# Patient Record
Sex: Female | Born: 1965 | Race: Black or African American | Hispanic: No | Marital: Married | Smoking: Never smoker
Health system: Southern US, Community
[De-identification: ages and names within clinical notes are randomized; demographics above are authoritative.]

## PROBLEM LIST (undated history)

## (undated) DIAGNOSIS — Z973 Presence of spectacles and contact lenses: Secondary | ICD-10-CM

## (undated) DIAGNOSIS — N83209 Unspecified ovarian cyst, unspecified side: Secondary | ICD-10-CM

## (undated) DIAGNOSIS — D500 Iron deficiency anemia secondary to blood loss (chronic): Secondary | ICD-10-CM

## (undated) DIAGNOSIS — E611 Iron deficiency: Secondary | ICD-10-CM

## (undated) DIAGNOSIS — N939 Abnormal uterine and vaginal bleeding, unspecified: Secondary | ICD-10-CM

## (undated) DIAGNOSIS — D259 Leiomyoma of uterus, unspecified: Secondary | ICD-10-CM

## (undated) DIAGNOSIS — D649 Anemia, unspecified: Secondary | ICD-10-CM

## (undated) DIAGNOSIS — M419 Scoliosis, unspecified: Secondary | ICD-10-CM

## (undated) HISTORY — DX: Anemia, unspecified: D64.9

## (undated) HISTORY — PX: NO PAST SURGERIES: SHX2092

---

## 2000-03-22 ENCOUNTER — Other Ambulatory Visit: Admission: RE | Admit: 2000-03-22 | Discharge: 2000-03-22 | Payer: Self-pay | Admitting: Obstetrics and Gynecology

## 2001-03-24 ENCOUNTER — Other Ambulatory Visit: Admission: RE | Admit: 2001-03-24 | Discharge: 2001-03-24 | Payer: Self-pay | Admitting: Obstetrics and Gynecology

## 2002-10-23 ENCOUNTER — Other Ambulatory Visit: Admission: RE | Admit: 2002-10-23 | Discharge: 2002-10-23 | Payer: Self-pay | Admitting: Obstetrics and Gynecology

## 2004-01-31 ENCOUNTER — Other Ambulatory Visit: Admission: RE | Admit: 2004-01-31 | Discharge: 2004-01-31 | Payer: Self-pay | Admitting: Obstetrics and Gynecology

## 2005-03-19 ENCOUNTER — Other Ambulatory Visit: Admission: RE | Admit: 2005-03-19 | Discharge: 2005-03-19 | Payer: Self-pay | Admitting: Obstetrics and Gynecology

## 2005-04-01 ENCOUNTER — Encounter: Admission: RE | Admit: 2005-04-01 | Discharge: 2005-04-01 | Payer: Self-pay | Admitting: Obstetrics and Gynecology

## 2006-06-16 ENCOUNTER — Encounter: Admission: RE | Admit: 2006-06-16 | Discharge: 2006-06-16 | Payer: Self-pay | Admitting: Obstetrics and Gynecology

## 2007-08-17 ENCOUNTER — Encounter: Admission: RE | Admit: 2007-08-17 | Discharge: 2007-08-17 | Payer: Self-pay | Admitting: Obstetrics and Gynecology

## 2008-04-11 ENCOUNTER — Ambulatory Visit (HOSPITAL_COMMUNITY): Admission: RE | Admit: 2008-04-11 | Discharge: 2008-04-11 | Payer: Self-pay | Admitting: Obstetrics and Gynecology

## 2008-10-04 ENCOUNTER — Encounter: Admission: RE | Admit: 2008-10-04 | Discharge: 2008-10-04 | Payer: Self-pay | Admitting: Obstetrics and Gynecology

## 2009-10-16 ENCOUNTER — Encounter: Admission: RE | Admit: 2009-10-16 | Discharge: 2009-10-16 | Payer: Self-pay | Admitting: Obstetrics and Gynecology

## 2010-04-17 ENCOUNTER — Emergency Department (HOSPITAL_COMMUNITY)
Admission: EM | Admit: 2010-04-17 | Discharge: 2010-04-17 | Disposition: A | Discharge status: Home / Self Care | Payer: No Typology Code available for payment source | Attending: Emergency Medicine | Admitting: Emergency Medicine

## 2010-04-17 DIAGNOSIS — M25559 Pain in unspecified hip: Secondary | ICD-10-CM | POA: Insufficient documentation

## 2010-04-17 DIAGNOSIS — M542 Cervicalgia: Secondary | ICD-10-CM | POA: Insufficient documentation

## 2010-04-17 DIAGNOSIS — M25569 Pain in unspecified knee: Secondary | ICD-10-CM | POA: Insufficient documentation

## 2010-04-17 DIAGNOSIS — R209 Unspecified disturbances of skin sensation: Secondary | ICD-10-CM | POA: Insufficient documentation

## 2010-04-17 DIAGNOSIS — N644 Mastodynia: Secondary | ICD-10-CM | POA: Insufficient documentation

## 2010-04-17 DIAGNOSIS — R51 Headache: Secondary | ICD-10-CM | POA: Insufficient documentation

## 2010-04-17 DIAGNOSIS — T148XXA Other injury of unspecified body region, initial encounter: Secondary | ICD-10-CM | POA: Insufficient documentation

## 2010-06-04 ENCOUNTER — Other Ambulatory Visit: Payer: No Typology Code available for payment source

## 2010-06-04 ENCOUNTER — Other Ambulatory Visit: Payer: Self-pay | Admitting: Family Medicine

## 2010-06-04 DIAGNOSIS — R609 Edema, unspecified: Secondary | ICD-10-CM

## 2010-06-05 ENCOUNTER — Ambulatory Visit
Admission: RE | Admit: 2010-06-05 | Discharge: 2010-06-05 | Disposition: A | Discharge status: Home / Self Care | Payer: BC Managed Care – PPO | Source: Ambulatory Visit | Attending: Family Medicine | Admitting: Family Medicine

## 2010-06-05 ENCOUNTER — Other Ambulatory Visit: Payer: Self-pay | Admitting: Family Medicine

## 2010-06-05 DIAGNOSIS — R609 Edema, unspecified: Secondary | ICD-10-CM

## 2010-06-08 ENCOUNTER — Other Ambulatory Visit: Payer: Self-pay | Admitting: Family Medicine

## 2010-06-08 DIAGNOSIS — M25561 Pain in right knee: Secondary | ICD-10-CM

## 2010-06-09 ENCOUNTER — Ambulatory Visit
Admission: RE | Admit: 2010-06-09 | Discharge: 2010-06-09 | Disposition: A | Discharge status: Home / Self Care | Payer: BC Managed Care – PPO | Source: Ambulatory Visit | Attending: Family Medicine | Admitting: Family Medicine

## 2010-06-09 DIAGNOSIS — M25561 Pain in right knee: Secondary | ICD-10-CM

## 2010-10-12 ENCOUNTER — Other Ambulatory Visit: Payer: Self-pay | Admitting: Obstetrics and Gynecology

## 2010-10-12 DIAGNOSIS — Z1231 Encounter for screening mammogram for malignant neoplasm of breast: Secondary | ICD-10-CM

## 2010-10-23 ENCOUNTER — Ambulatory Visit
Admission: RE | Admit: 2010-10-23 | Discharge: 2010-10-23 | Disposition: A | Discharge status: Home / Self Care | Payer: BC Managed Care – PPO | Source: Ambulatory Visit | Attending: Obstetrics and Gynecology | Admitting: Obstetrics and Gynecology

## 2010-10-23 DIAGNOSIS — Z1231 Encounter for screening mammogram for malignant neoplasm of breast: Secondary | ICD-10-CM

## 2011-08-17 ENCOUNTER — Other Ambulatory Visit: Payer: Self-pay | Admitting: Obstetrics and Gynecology

## 2011-08-17 DIAGNOSIS — Z1231 Encounter for screening mammogram for malignant neoplasm of breast: Secondary | ICD-10-CM

## 2011-11-09 ENCOUNTER — Ambulatory Visit
Admission: RE | Admit: 2011-11-09 | Discharge: 2011-11-09 | Disposition: A | Discharge status: Home / Self Care | Payer: BC Managed Care – PPO | Source: Ambulatory Visit | Attending: Obstetrics and Gynecology | Admitting: Obstetrics and Gynecology

## 2011-11-09 DIAGNOSIS — Z1231 Encounter for screening mammogram for malignant neoplasm of breast: Secondary | ICD-10-CM

## 2012-10-09 ENCOUNTER — Other Ambulatory Visit: Payer: Self-pay

## 2012-10-09 DIAGNOSIS — Z1231 Encounter for screening mammogram for malignant neoplasm of breast: Secondary | ICD-10-CM

## 2012-11-17 ENCOUNTER — Ambulatory Visit
Admission: RE | Admit: 2012-11-17 | Discharge: 2012-11-17 | Disposition: A | Discharge status: Home / Self Care | Payer: BC Managed Care – PPO | Source: Ambulatory Visit

## 2012-11-17 DIAGNOSIS — Z1231 Encounter for screening mammogram for malignant neoplasm of breast: Secondary | ICD-10-CM

## 2013-10-24 ENCOUNTER — Other Ambulatory Visit: Payer: Self-pay

## 2013-10-24 DIAGNOSIS — Z1231 Encounter for screening mammogram for malignant neoplasm of breast: Secondary | ICD-10-CM

## 2013-11-19 ENCOUNTER — Encounter (INDEPENDENT_AMBULATORY_CARE_PROVIDER_SITE_OTHER): Payer: Self-pay

## 2013-11-19 ENCOUNTER — Ambulatory Visit
Admission: RE | Admit: 2013-11-19 | Discharge: 2013-11-19 | Disposition: A | Discharge status: Home / Self Care | Payer: BC Managed Care – PPO | Source: Ambulatory Visit

## 2013-11-19 DIAGNOSIS — Z1231 Encounter for screening mammogram for malignant neoplasm of breast: Secondary | ICD-10-CM

## 2014-09-24 ENCOUNTER — Other Ambulatory Visit: Payer: Self-pay

## 2014-09-24 DIAGNOSIS — Z1231 Encounter for screening mammogram for malignant neoplasm of breast: Secondary | ICD-10-CM

## 2014-11-27 ENCOUNTER — Ambulatory Visit
Admission: RE | Admit: 2014-11-27 | Discharge: 2014-11-27 | Disposition: A | Discharge status: Home / Self Care | Payer: BLUE CROSS/BLUE SHIELD | Source: Ambulatory Visit

## 2014-11-27 DIAGNOSIS — Z1231 Encounter for screening mammogram for malignant neoplasm of breast: Secondary | ICD-10-CM

## 2015-05-27 DIAGNOSIS — N920 Excessive and frequent menstruation with regular cycle: Secondary | ICD-10-CM | POA: Diagnosis not present

## 2015-05-30 DIAGNOSIS — Z1151 Encounter for screening for human papillomavirus (HPV): Secondary | ICD-10-CM | POA: Diagnosis not present

## 2015-05-30 DIAGNOSIS — N946 Dysmenorrhea, unspecified: Secondary | ICD-10-CM | POA: Diagnosis not present

## 2015-05-30 DIAGNOSIS — Z1389 Encounter for screening for other disorder: Secondary | ICD-10-CM | POA: Diagnosis not present

## 2015-05-30 DIAGNOSIS — Z01419 Encounter for gynecological examination (general) (routine) without abnormal findings: Secondary | ICD-10-CM | POA: Diagnosis not present

## 2015-05-30 DIAGNOSIS — Z6829 Body mass index (BMI) 29.0-29.9, adult: Secondary | ICD-10-CM | POA: Diagnosis not present

## 2015-05-30 DIAGNOSIS — Z124 Encounter for screening for malignant neoplasm of cervix: Secondary | ICD-10-CM | POA: Diagnosis not present

## 2015-05-30 DIAGNOSIS — Z130 Encounter for screening for diseases of the blood and blood-forming organs and certain disorders involving the immune mechanism: Secondary | ICD-10-CM | POA: Diagnosis not present

## 2015-05-30 DIAGNOSIS — N920 Excessive and frequent menstruation with regular cycle: Secondary | ICD-10-CM | POA: Diagnosis not present

## 2015-06-11 DIAGNOSIS — R1032 Left lower quadrant pain: Secondary | ICD-10-CM | POA: Diagnosis not present

## 2015-06-11 DIAGNOSIS — N920 Excessive and frequent menstruation with regular cycle: Secondary | ICD-10-CM | POA: Diagnosis not present

## 2015-11-21 ENCOUNTER — Other Ambulatory Visit: Payer: Self-pay | Admitting: Obstetrics and Gynecology

## 2015-11-21 DIAGNOSIS — Z1231 Encounter for screening mammogram for malignant neoplasm of breast: Secondary | ICD-10-CM

## 2015-12-30 ENCOUNTER — Ambulatory Visit
Admission: RE | Admit: 2015-12-30 | Discharge: 2015-12-30 | Disposition: A | Discharge status: Home / Self Care | Payer: BLUE CROSS/BLUE SHIELD | Source: Ambulatory Visit | Attending: Obstetrics and Gynecology | Admitting: Obstetrics and Gynecology

## 2015-12-30 DIAGNOSIS — Z1231 Encounter for screening mammogram for malignant neoplasm of breast: Secondary | ICD-10-CM | POA: Diagnosis not present

## 2016-01-05 HISTORY — PX: COLONOSCOPY: SHX174

## 2016-09-21 DIAGNOSIS — Z0000 Encounter for general adult medical examination without abnormal findings: Secondary | ICD-10-CM | POA: Diagnosis not present

## 2016-09-21 DIAGNOSIS — Z13220 Encounter for screening for lipoid disorders: Secondary | ICD-10-CM | POA: Diagnosis not present

## 2016-09-21 DIAGNOSIS — D509 Iron deficiency anemia, unspecified: Secondary | ICD-10-CM | POA: Diagnosis not present

## 2016-09-21 DIAGNOSIS — Z23 Encounter for immunization: Secondary | ICD-10-CM | POA: Diagnosis not present

## 2016-11-19 ENCOUNTER — Other Ambulatory Visit: Payer: Self-pay | Admitting: Obstetrics and Gynecology

## 2016-11-19 DIAGNOSIS — Z1211 Encounter for screening for malignant neoplasm of colon: Secondary | ICD-10-CM | POA: Diagnosis not present

## 2016-11-19 DIAGNOSIS — Z1231 Encounter for screening mammogram for malignant neoplasm of breast: Secondary | ICD-10-CM

## 2016-11-19 DIAGNOSIS — K640 First degree hemorrhoids: Secondary | ICD-10-CM | POA: Diagnosis not present

## 2016-12-31 ENCOUNTER — Ambulatory Visit
Admission: RE | Admit: 2016-12-31 | Discharge: 2016-12-31 | Disposition: A | Discharge status: Home / Self Care | Payer: BLUE CROSS/BLUE SHIELD | Source: Ambulatory Visit | Attending: Obstetrics and Gynecology | Admitting: Obstetrics and Gynecology

## 2016-12-31 DIAGNOSIS — Z1231 Encounter for screening mammogram for malignant neoplasm of breast: Secondary | ICD-10-CM | POA: Diagnosis not present

## 2017-05-31 DIAGNOSIS — M7989 Other specified soft tissue disorders: Secondary | ICD-10-CM | POA: Diagnosis not present

## 2017-05-31 DIAGNOSIS — D509 Iron deficiency anemia, unspecified: Secondary | ICD-10-CM | POA: Diagnosis not present

## 2017-12-22 ENCOUNTER — Other Ambulatory Visit: Payer: Self-pay | Admitting: Obstetrics and Gynecology

## 2017-12-22 DIAGNOSIS — Z1231 Encounter for screening mammogram for malignant neoplasm of breast: Secondary | ICD-10-CM

## 2018-01-27 ENCOUNTER — Ambulatory Visit: Payer: BLUE CROSS/BLUE SHIELD

## 2018-02-14 ENCOUNTER — Ambulatory Visit
Admission: RE | Admit: 2018-02-14 | Discharge: 2018-02-14 | Disposition: A | Discharge status: Home / Self Care | Payer: BLUE CROSS/BLUE SHIELD | Source: Ambulatory Visit | Attending: Obstetrics and Gynecology | Admitting: Obstetrics and Gynecology

## 2018-02-14 DIAGNOSIS — Z1231 Encounter for screening mammogram for malignant neoplasm of breast: Secondary | ICD-10-CM | POA: Diagnosis not present

## 2018-02-15 ENCOUNTER — Other Ambulatory Visit: Payer: Self-pay | Admitting: Obstetrics and Gynecology

## 2018-02-15 DIAGNOSIS — R928 Other abnormal and inconclusive findings on diagnostic imaging of breast: Secondary | ICD-10-CM

## 2018-02-17 ENCOUNTER — Ambulatory Visit: Payer: BLUE CROSS/BLUE SHIELD

## 2018-02-17 ENCOUNTER — Ambulatory Visit
Admission: RE | Admit: 2018-02-17 | Discharge: 2018-02-17 | Disposition: A | Discharge status: Home / Self Care | Payer: BLUE CROSS/BLUE SHIELD | Source: Ambulatory Visit | Attending: Obstetrics and Gynecology | Admitting: Obstetrics and Gynecology

## 2018-02-17 DIAGNOSIS — R928 Other abnormal and inconclusive findings on diagnostic imaging of breast: Secondary | ICD-10-CM

## 2018-07-25 ENCOUNTER — Other Ambulatory Visit: Payer: Self-pay

## 2018-07-25 DIAGNOSIS — Z20828 Contact with and (suspected) exposure to other viral communicable diseases: Secondary | ICD-10-CM

## 2018-07-25 DIAGNOSIS — Z20822 Contact with and (suspected) exposure to covid-19: Secondary | ICD-10-CM

## 2018-07-25 NOTE — Progress Notes (Signed)
[facility]

## 2018-07-28 LAB — NOVEL CORONAVIRUS, NAA: SARS-CoV-2, NAA: NOT DETECTED

## 2019-03-16 ENCOUNTER — Ambulatory Visit: Payer: BC Managed Care – PPO | Attending: Internal Medicine

## 2019-03-16 DIAGNOSIS — Z23 Encounter for immunization: Secondary | ICD-10-CM

## 2019-03-16 NOTE — Progress Notes (Signed)
   Covid-19 Vaccination Clinic  Name:  Melissa Downs    MRN: [id number] DOB: 28-Jul-1965  03/16/2019  [name] was observed post Covid-19 immunization for 15 minutes without incident. She was provided with Vaccine Information Sheet and instruction to access the V-Safe system.   [name] was instructed to call 911 with any severe reactions post vaccine: Marland Kitchen Difficulty breathing  . Swelling of face and throat  . A fast heartbeat  . A bad rash all over body  . Dizziness and weakness   Immunizations Administered    Name Date Dose VIS Date Route   Pfizer COVID-19 Vaccine 03/16/2019  3:07 PM 0.3 mL 12/15/2018 Intramuscular   Manufacturer: [place]   Lot: [id number]   [place]: [id number]

## 2019-04-09 ENCOUNTER — Ambulatory Visit: Payer: BC Managed Care – PPO | Attending: Internal Medicine

## 2019-04-09 DIAGNOSIS — Z23 Encounter for immunization: Secondary | ICD-10-CM

## 2019-04-09 NOTE — Progress Notes (Signed)
   Covid-19 Vaccination Clinic  Name:  Krysten Hooven    MRN: [id number] DOB: March 06, 1965  04/09/2019  Ms. Gautreaux was observed post Covid-19 immunization for 15 minutes without incident. She was provided with Vaccine Information Sheet and instruction to access the V-Safe system.   Ms. Lanzillo was instructed to call 911 with any severe reactions post vaccine: Marland Kitchen Difficulty breathing  . Swelling of face and throat  . A fast heartbeat  . A bad rash all over body  . Dizziness and weakness   Immunizations Administered    Name Date Dose VIS Date Route   Pfizer COVID-19 Vaccine 04/09/2019  4:36 PM 0.3 mL 12/15/2018 Intramuscular   Manufacturer: [employer]   Lot: [id number]   [place]: [id number]

## 2019-09-02 DIAGNOSIS — S8991XA Unspecified injury of right lower leg, initial encounter: Secondary | ICD-10-CM | POA: Diagnosis not present

## 2019-09-02 DIAGNOSIS — S59911A Unspecified injury of right forearm, initial encounter: Secondary | ICD-10-CM | POA: Diagnosis not present

## 2019-09-02 DIAGNOSIS — M542 Cervicalgia: Secondary | ICD-10-CM | POA: Diagnosis not present

## 2019-09-06 DIAGNOSIS — D509 Iron deficiency anemia, unspecified: Secondary | ICD-10-CM | POA: Diagnosis not present

## 2019-09-06 DIAGNOSIS — N926 Irregular menstruation, unspecified: Secondary | ICD-10-CM | POA: Diagnosis not present

## 2019-09-06 DIAGNOSIS — L659 Nonscarring hair loss, unspecified: Secondary | ICD-10-CM | POA: Diagnosis not present

## 2019-09-07 ENCOUNTER — Telehealth: Payer: Self-pay | Admitting: Hematology and Oncology

## 2019-09-07 ENCOUNTER — Inpatient Hospital Stay: Payer: BC Managed Care – PPO | Attending: Hematology and Oncology | Admitting: Hematology and Oncology

## 2019-09-07 ENCOUNTER — Encounter: Payer: Self-pay | Admitting: Hematology and Oncology

## 2019-09-07 ENCOUNTER — Other Ambulatory Visit: Payer: Self-pay

## 2019-09-07 DIAGNOSIS — Z7982 Long term (current) use of aspirin: Secondary | ICD-10-CM | POA: Diagnosis not present

## 2019-09-07 DIAGNOSIS — D500 Iron deficiency anemia secondary to blood loss (chronic): Secondary | ICD-10-CM

## 2019-09-07 DIAGNOSIS — N920 Excessive and frequent menstruation with regular cycle: Secondary | ICD-10-CM

## 2019-09-07 DIAGNOSIS — K909 Intestinal malabsorption, unspecified: Secondary | ICD-10-CM

## 2019-09-07 DIAGNOSIS — R5383 Other fatigue: Secondary | ICD-10-CM

## 2019-09-07 DIAGNOSIS — Z79899 Other long term (current) drug therapy: Secondary | ICD-10-CM | POA: Diagnosis not present

## 2019-09-07 DIAGNOSIS — N924 Excessive bleeding in the premenopausal period: Secondary | ICD-10-CM | POA: Diagnosis not present

## 2019-09-07 DIAGNOSIS — D539 Nutritional anemia, unspecified: Secondary | ICD-10-CM

## 2019-09-07 NOTE — Progress Notes (Signed)
[facility] CONSULT NOTE  Patient Care Team: Scifres, Durel Salts as PCP - General (Physician Assistant)  CHIEF COMPLAINTS/PURPOSE OF CONSULTATION:  Severe iron deficiency anemia  HISTORY OF PRESENTING ILLNESS:  Melissa Downs 54 y.o. female is here because of recent findings of severe iron deficiency anemia  She was found to have abnormal CBC from recent blood draw She has complained of mild fatigue She is known to be anemic all her life Her mother, sister and daughter all suffer from anemia On September 06, 2019, blood count from her primary care physician office show white count of 5.4, hemoglobin of 6.1, MCV of 57.8 and platelet count of 369 Her iron studies showed low iron saturation of 2% I do not have comparative CBC from previous She denies recent chest pain on exertion, shortness of breath on minimal exertion, pre-syncopal episodes, or palpitations. Her only symptom is mild fatigue She had not noticed any recent bleeding such as epistaxis, hematuria or hematochezia The patient takes naproxen and aspirin intermittently. She is not on antiplatelets agents. Her last colonoscopy was at the age of 85 and she was told it was normal She had no prior history or diagnosis of cancer. Her age appropriate screening programs are up-to-date. She has pica with craving for ice. She eats a variety of diet. She never donated blood or received blood transfusion The patient was prescribed oral iron supplements and she takes 1 daily with meals  MEDICAL HISTORY:  Past Medical History:  Diagnosis Date  . Anemia     SURGICAL HISTORY: History reviewed. No pertinent surgical history.  SOCIAL HISTORY: Social History   Socioeconomic History  . Marital status: Married    Spouse name: Not on file  . Number of children: 2  . Years of education: Not on file  . Highest education level: Not on file  Occupational History  . Occupation: Freight forwarder  Tobacco Use  . Smoking  status: Never Smoker  . Smokeless tobacco: Never Used  Substance and Sexual Activity  . Alcohol use: Not on file  . Drug use: Not on file  . Sexual activity: Not on file  Other Topics Concern  . Not on file  Social History Narrative  . Not on file   Social Determinants of Health   Financial Resource Strain:   . Difficulty of Paying Living Expenses: Not on file  Food Insecurity:   . Worried About Charity fundraiser in the Last Year: Not on file  . Ran Out of Food in the Last Year: Not on file  Transportation Needs:   . Lack of Transportation (Medical): Not on file  . Lack of Transportation (Non-Medical): Not on file  Physical Activity:   . Days of Exercise per Week: Not on file  . Minutes of Exercise per Session: Not on file  Stress:   . Feeling of Stress : Not on file  Social Connections:   . Frequency of Communication with Friends and Family: Not on file  . Frequency of Social Gatherings with Friends and Family: Not on file  . Attends Religious Services: Not on file  . Active Member of Clubs or Organizations: Not on file  . Attends Archivist Meetings: Not on file  . Marital Status: Not on file  Intimate Partner Violence:   . Fear of Current or Ex-Partner: Not on file  . Emotionally Abused: Not on file  . Physically Abused: Not on file  . Sexually Abused: Not on file  FAMILY HISTORY: History reviewed. No pertinent family history.  ALLERGIES:  has No Known Allergies.  MEDICATIONS:  Current Outpatient Medications  Medication Sig Dispense Refill  . ALPRAZolam (XANAX) 0.5 MG tablet alprazolam 0.5 mg tablet  Take 1 tablet as needed by oral route as directed for 10 days.    . Azelastine-Fluticasone (DYMISTA) 137-50 MCG/ACT SUSP Place 137 mcg into the nose.    . cetirizine (ZYRTEC) 10 MG tablet Take 10 mg by mouth daily.    . ferrous sulfate 325 (65 FE) MG tablet Take 325 mg by mouth daily with breakfast.    . Iron-FA-B Cmp-C-Biot-Probiotic (FUSION PLUS)  CAPS Take by mouth.    . montelukast (SINGULAIR) 10 MG tablet Take 10 mg by mouth at bedtime.    . Multiple Vitamin (MULTIVITAMIN) tablet Take 1 tablet by mouth daily.     No current facility-administered medications for this visit.    REVIEW OF SYSTEMS:   Constitutional: Denies fevers, chills or abnormal night sweats Eyes: Denies blurriness of vision, double vision or watery eyes Ears, nose, mouth, throat, and face: Denies mucositis or sore throat Respiratory: Denies cough, dyspnea or wheezes Cardiovascular: Denies palpitation, chest discomfort or lower extremity swelling Gastrointestinal:  Denies nausea, heartburn or change in bowel habits Skin: Denies abnormal skin rashes Lymphatics: Denies new lymphadenopathy or easy bruising Neurological:Denies numbness, tingling or new weaknesses Behavioral/Psych: Mood is stable, no new changes  All other systems were reviewed with the patient and are negative.  PHYSICAL EXAMINATION: ECOG PERFORMANCE STATUS: 1 - Symptomatic but completely ambulatory  Vitals:   09/07/19 1309  BP: 120/69  Pulse: (!) 101  Resp: 18  Temp: 97.8 F (36.6 C)  SpO2: 100%   Filed Weights   09/07/19 1309  Weight: 182 lb 12.8 oz (82.9 kg)    GENERAL:alert, no distress and comfortable.  She looks pale SKIN: skin color, texture, turgor are normal, no rashes or significant lesions EYES: normal, conjunctiva are pale and non-injected, sclera clear OROPHARYNX:no exudate, no erythema and lips, buccal mucosa, and tongue normal  NECK: supple, thyroid normal size, non-tender, without nodularity LYMPH:  no palpable lymphadenopathy in the cervical, axillary or inguinal LUNGS: clear to auscultation and percussion with normal breathing effort HEART: regular rate & rhythm and no murmurs and no lower extremity edema ABDOMEN:abdomen soft, non-tender and normal bowel sounds Musculoskeletal:no cyanosis of digits and no clubbing  PSYCH: alert & oriented x 3 with fluent  speech NEURO: no focal motor/sensory deficits   ASSESSMENT & PLAN:  Iron deficiency anemia due to chronic blood loss Surprisingly, she is not symptomatic except for some mild fatigue We discussed the risk and benefits of blood transfusion but she is not interested The most likely cause of her anemia is due to chronic blood loss/malabsorption syndrome. We discussed some of the risks, benefits, and alternatives of intravenous iron infusions. The patient is symptomatic from anemia and the iron level is critically low. She tolerated oral iron supplement poorly and desires to achieved higher levels of iron faster for adequate hematopoesis. Some of the side-effects to be expected including risks of infusion reactions, phlebitis, headaches, nausea and fatigue.  The patient is willing to proceed. Patient education material was dispensed.  Goal is to keep ferritin level greater than 50 and resolution of anemia We will schedule II doses of intravenous iron infusion and I plan to see her back next month for further follow-up Given the severity of her anemia, it is likely she will need more than 2 doses Given  strong family history of anemia, if her iron panel normalized but she remains anemic, I will order hemoglobin electrophoresis to rule out thalassemia   Menorrhagia, premenopausal She has occasional intermittent heavy menstruation She never have abnormal Pap smear She will continue follow-up with gynecologist I recommend she refrain from taking aspirin therapy due to risk of heavy menstruation  Orders Placed This Encounter  Procedures  . CBC with Differential/Platelet    Standing Status:   Standing    Number of Occurrences:   22    Standing Expiration Date:   09/06/2020  . Ferritin    Standing Status:   Future    Standing Expiration Date:   09/06/2020  . Iron and TIBC    Standing Status:   Future    Standing Expiration Date:   09/06/2020  . Vitamin B12    Standing Status:   Future    Standing  Expiration Date:   09/06/2020    All questions were answered. The patient knows to call the clinic with any problems, questions or concerns.  The total time spent in the appointment was 55 minutes encounter with patients including review of chart and various tests results, discussions about plan of care and coordination of care plan  Heath Lark, MD 9/3/20212:56 PM       Heath Lark, MD 09/07/19 2:56 PM

## 2019-09-07 NOTE — Assessment & Plan Note (Signed)
She has occasional intermittent heavy menstruation She never have abnormal Pap smear She will continue follow-up with gynecologist I recommend she refrain from taking aspirin therapy due to risk of heavy menstruation

## 2019-09-07 NOTE — Telephone Encounter (Signed)
Received an urgent referral from Maude Leriche, PA from [facility] for anemia. Pt returned my call and has been scheduled to see Dr. Alvy Bimler today 1pm. Aware to arrive 20-30 minutes early.

## 2019-09-07 NOTE — Assessment & Plan Note (Signed)
Surprisingly, she is not symptomatic except for some mild fatigue We discussed the risk and benefits of blood transfusion but she is not interested The most likely cause of her anemia is due to chronic blood loss/malabsorption syndrome. We discussed some of the risks, benefits, and alternatives of intravenous iron infusions. The patient is symptomatic from anemia and the iron level is critically low. She tolerated oral iron supplement poorly and desires to achieved higher levels of iron faster for adequate hematopoesis. Some of the side-effects to be expected including risks of infusion reactions, phlebitis, headaches, nausea and fatigue.  The patient is willing to proceed. Patient education material was dispensed.  Goal is to keep ferritin level greater than 50 and resolution of anemia We will schedule II doses of intravenous iron infusion and I plan to see her back next month for further follow-up Given the severity of her anemia, it is likely she will need more than 2 doses Given strong family history of anemia, if her iron panel normalized but she remains anemic, I will order hemoglobin electrophoresis to rule out thalassemia

## 2019-09-14 ENCOUNTER — Other Ambulatory Visit: Payer: Self-pay

## 2019-09-14 ENCOUNTER — Inpatient Hospital Stay: Payer: BC Managed Care – PPO

## 2019-09-14 VITALS — BP 123/60 | HR 84 | Temp 98.5°F | Resp 16

## 2019-09-14 DIAGNOSIS — Z79899 Other long term (current) drug therapy: Secondary | ICD-10-CM | POA: Diagnosis not present

## 2019-09-14 DIAGNOSIS — Z7982 Long term (current) use of aspirin: Secondary | ICD-10-CM | POA: Diagnosis not present

## 2019-09-14 DIAGNOSIS — D500 Iron deficiency anemia secondary to blood loss (chronic): Secondary | ICD-10-CM | POA: Diagnosis not present

## 2019-09-14 DIAGNOSIS — N924 Excessive bleeding in the premenopausal period: Secondary | ICD-10-CM | POA: Diagnosis not present

## 2019-09-14 DIAGNOSIS — N920 Excessive and frequent menstruation with regular cycle: Secondary | ICD-10-CM | POA: Diagnosis not present

## 2019-09-14 DIAGNOSIS — K909 Intestinal malabsorption, unspecified: Secondary | ICD-10-CM | POA: Diagnosis not present

## 2019-09-14 DIAGNOSIS — R5383 Other fatigue: Secondary | ICD-10-CM | POA: Diagnosis not present

## 2019-09-14 MED ORDER — SODIUM CHLORIDE 0.9 % IV SOLN
Freq: Once | INTRAVENOUS | Status: AC
  Filled 2019-09-14: qty 250

## 2019-09-14 MED ORDER — SODIUM CHLORIDE 0.9 % IV SOLN
510.0000 mg | Freq: Once | INTRAVENOUS | Status: AC
  Administered 2019-09-14: 510 mg via INTRAVENOUS
  Filled 2019-09-14: qty 510

## 2019-09-14 NOTE — Patient Instructions (Signed)

## 2019-09-21 ENCOUNTER — Other Ambulatory Visit: Payer: Self-pay

## 2019-09-21 ENCOUNTER — Inpatient Hospital Stay: Payer: BC Managed Care – PPO

## 2019-09-21 VITALS — BP 117/73 | HR 76 | Temp 98.2°F | Resp 20

## 2019-09-21 DIAGNOSIS — R5383 Other fatigue: Secondary | ICD-10-CM | POA: Diagnosis not present

## 2019-09-21 DIAGNOSIS — N920 Excessive and frequent menstruation with regular cycle: Secondary | ICD-10-CM | POA: Diagnosis not present

## 2019-09-21 DIAGNOSIS — N924 Excessive bleeding in the premenopausal period: Secondary | ICD-10-CM | POA: Diagnosis not present

## 2019-09-21 DIAGNOSIS — Z79899 Other long term (current) drug therapy: Secondary | ICD-10-CM | POA: Diagnosis not present

## 2019-09-21 DIAGNOSIS — Z7982 Long term (current) use of aspirin: Secondary | ICD-10-CM | POA: Diagnosis not present

## 2019-09-21 DIAGNOSIS — D500 Iron deficiency anemia secondary to blood loss (chronic): Secondary | ICD-10-CM | POA: Diagnosis not present

## 2019-09-21 DIAGNOSIS — K909 Intestinal malabsorption, unspecified: Secondary | ICD-10-CM | POA: Diagnosis not present

## 2019-09-21 MED ORDER — SODIUM CHLORIDE 0.9 % IV SOLN
510.0000 mg | Freq: Once | INTRAVENOUS | Status: AC
  Administered 2019-09-21: 510 mg via INTRAVENOUS
  Filled 2019-09-21: qty 510

## 2019-09-21 MED ORDER — SODIUM CHLORIDE 0.9 % IV SOLN
Freq: Once | INTRAVENOUS | Status: AC
  Filled 2019-09-21: qty 250

## 2019-09-21 NOTE — Patient Instructions (Signed)

## 2019-10-19 ENCOUNTER — Other Ambulatory Visit: Payer: Self-pay | Admitting: Hematology and Oncology

## 2019-10-26 ENCOUNTER — Inpatient Hospital Stay: Payer: BC Managed Care – PPO | Admitting: Hematology and Oncology

## 2019-10-26 ENCOUNTER — Other Ambulatory Visit: Payer: Self-pay | Admitting: Hematology and Oncology

## 2019-10-26 ENCOUNTER — Inpatient Hospital Stay: Payer: BC Managed Care – PPO

## 2019-10-26 ENCOUNTER — Inpatient Hospital Stay: Payer: BC Managed Care – PPO | Attending: Hematology and Oncology

## 2019-10-26 ENCOUNTER — Other Ambulatory Visit: Payer: Self-pay

## 2019-10-26 ENCOUNTER — Encounter: Payer: Self-pay | Admitting: Hematology and Oncology

## 2019-10-26 VITALS — BP 115/70 | HR 75 | Temp 98.4°F | Resp 16

## 2019-10-26 DIAGNOSIS — D500 Iron deficiency anemia secondary to blood loss (chronic): Secondary | ICD-10-CM

## 2019-10-26 DIAGNOSIS — F5089 Other specified eating disorder: Secondary | ICD-10-CM | POA: Insufficient documentation

## 2019-10-26 DIAGNOSIS — N924 Excessive bleeding in the premenopausal period: Secondary | ICD-10-CM | POA: Diagnosis not present

## 2019-10-26 DIAGNOSIS — Z79899 Other long term (current) drug therapy: Secondary | ICD-10-CM | POA: Insufficient documentation

## 2019-10-26 DIAGNOSIS — R5383 Other fatigue: Secondary | ICD-10-CM | POA: Diagnosis not present

## 2019-10-26 DIAGNOSIS — N920 Excessive and frequent menstruation with regular cycle: Secondary | ICD-10-CM | POA: Insufficient documentation

## 2019-10-26 DIAGNOSIS — D539 Nutritional anemia, unspecified: Secondary | ICD-10-CM

## 2019-10-26 LAB — CBC WITH DIFFERENTIAL/PLATELET
Abs Immature Granulocytes: 0.01 10*3/uL (ref 0.00–0.07)
Basophils Absolute: 0 10*3/uL (ref 0.0–0.1)
Basophils Relative: 0 %
Eosinophils Absolute: 0.1 10*3/uL (ref 0.0–0.5)
Eosinophils Relative: 2 %
HCT: 33.1 % — ABNORMAL LOW (ref 36.0–46.0)
Hemoglobin: 10.3 g/dL — ABNORMAL LOW (ref 12.0–15.0)
Immature Granulocytes: 0 %
Lymphocytes Relative: 29 %
Lymphs Abs: 1.4 10*3/uL (ref 0.7–4.0)
MCH: 25.9 pg — ABNORMAL LOW (ref 26.0–34.0)
MCHC: 31.1 g/dL (ref 30.0–36.0)
MCV: 83.4 fL (ref 80.0–100.0)
Monocytes Absolute: 0.5 10*3/uL (ref 0.1–1.0)
Monocytes Relative: 10 %
Neutro Abs: 3 10*3/uL (ref 1.7–7.7)
Neutrophils Relative %: 59 %
Platelets: 288 10*3/uL (ref 150–400)
RBC: 3.97 MIL/uL (ref 3.87–5.11)
RDW: 25.2 % — ABNORMAL HIGH (ref 11.5–15.5)
WBC: 5 10*3/uL (ref 4.0–10.5)
nRBC: 0 % (ref 0.0–0.2)

## 2019-10-26 LAB — IRON AND TIBC
Iron: 40 ug/dL — ABNORMAL LOW (ref 41–142)
Saturation Ratios: 13 % — ABNORMAL LOW (ref 21–57)
TIBC: 304 ug/dL (ref 236–444)
UIBC: 264 ug/dL (ref 120–384)

## 2019-10-26 LAB — VITAMIN B12: Vitamin B-12: 347 pg/mL (ref 180–914)

## 2019-10-26 LAB — FERRITIN: Ferritin: 24 ng/mL (ref 11–307)

## 2019-10-26 MED ORDER — SODIUM CHLORIDE 0.9 % IV SOLN
Freq: Once | INTRAVENOUS | Status: AC
  Filled 2019-10-26: qty 250

## 2019-10-26 MED ORDER — SODIUM CHLORIDE 0.9 % IV SOLN
300.0000 mg | Freq: Once | INTRAVENOUS | Status: AC
  Administered 2019-10-26: 300 mg via INTRAVENOUS
  Filled 2019-10-26: qty 10

## 2019-10-26 NOTE — Assessment & Plan Note (Signed)
She has occasional intermittent heavy menstruation She never have abnormal Pap smear She is seeking help from primary care doctor/gynecologist for further management I recommend her to refrain from taking aspirin or NSAID due to risk of heavy menstruation

## 2019-10-26 NOTE — Assessment & Plan Note (Signed)
She has amazing response to intravenous iron infusion She has excellent energy level and able to exercise, as a result, have lost some weight She denies recent pica She continues to have heavy menstruation and is seeking help from her primary care doctor  I recommend further intravenous iron infusion for 2 more doses, with the goal to normalize her hemoglobin and to build a reserve of iron, with goal ferritin greater than 50 I will schedule I more dose of intravenous iron next week and then see her back in January for further follow-up She is in agreement with the plan of care

## 2019-10-26 NOTE — Patient Instructions (Signed)

## 2019-10-26 NOTE — Progress Notes (Signed)
[facility] OFFICE PROGRESS NOTE  Scifres, Dorothy, PA-C  ASSESSMENT & PLAN:  Iron deficiency anemia due to chronic blood loss She has amazing response to intravenous iron infusion She has excellent energy level and able to exercise, as a result, have lost some weight She denies recent pica She continues to have heavy menstruation and is seeking help from her primary care doctor  I recommend further intravenous iron infusion for 2 more doses, with the goal to normalize her hemoglobin and to build a reserve of iron, with goal ferritin greater than 50 I will schedule I more dose of intravenous iron next week and then see her back in January for further follow-up She is in agreement with the plan of care  Menorrhagia, premenopausal She has occasional intermittent heavy menstruation She never have abnormal Pap smear She is seeking help from primary care doctor/gynecologist for further management I recommend her to refrain from taking aspirin or NSAID due to risk of heavy menstruation   Orders Placed This Encounter  Procedures  . Iron and TIBC    Standing Status:   Standing    Number of Occurrences:   3    Standing Expiration Date:   10/25/2020  . Ferritin    Standing Status:   Standing    Number of Occurrences:   3    Standing Expiration Date:   10/25/2020    The total time spent in the appointment was 20 minutes encounter with patients including review of chart and various tests results, discussions about plan of care and coordination of care plan   All questions were answered. The patient knows to call the clinic with any problems, questions or concerns. No barriers to learning was detected.    Heath Lark, MD 10/22/20219:02 AM  INTERVAL HISTORY: Melissa Downs 54 y.o. female returns for further follow-up on iron deficiency anemia She tolerated intravenous iron infusion well She denies further pica within the few days after receiving IV iron last  month She had one episode of heavy menstruation recently She denies recent NSAID or aspirin use She has excellent energy level and started exercising and lost some weight She felt that her hair is growing back nicely She denies side effects from intravenous iron infusion The patient denies any recent signs or symptoms of bleeding such as spontaneous epistaxis, hematuria or hematochezia.   SUMMARY OF HEMATOLOGIC HISTORY:  She was found to have abnormal CBC from recent blood draw She has complained of mild fatigue She is known to be anemic all her life Her mother, sister and daughter all suffer from anemia On September 06, 2019, blood count from her primary care physician office show white count of 5.4, hemoglobin of 6.1, MCV of 57.8 and platelet count of 369 Her iron studies showed low iron saturation of 2% I do not have comparative CBC from previous She denies recent chest pain on exertion, shortness of breath on minimal exertion, pre-syncopal episodes, or palpitations. Her only symptom is mild fatigue She had not noticed any recent bleeding such as epistaxis, hematuria or hematochezia The patient takes naproxen and aspirin intermittently. She is not on antiplatelets agents. Her last colonoscopy was at the age of 82 and she was told it was normal She had no prior history or diagnosis of cancer. Her age appropriate screening programs are up-to-date. She has pica with craving for ice. She eats a variety of diet. She never donated blood or received blood transfusion The patient was prescribed oral iron supplements  and she takes 1 daily with meals without benefit In September 2021, she received 2 doses of intravenous iron infusion  I have reviewed the past medical history, past surgical history, social history and family history with the patient and they are unchanged from previous note.  ALLERGIES:  has No Known Allergies.  MEDICATIONS:  Current Outpatient Medications  Medication Sig  Dispense Refill  . Azelastine-Fluticasone (DYMISTA) 137-50 MCG/ACT SUSP Place 137 mcg into the nose.    . cetirizine (ZYRTEC) 10 MG tablet Take 10 mg by mouth daily.    . CYCLOBENZAPRINE HCL PO Take by mouth as needed.    Marland Kitchen DICLOFENAC PO Take by mouth as needed.    . montelukast (SINGULAIR) 10 MG tablet Take 10 mg by mouth at bedtime.    . Multiple Vitamin (MULTIVITAMIN) tablet Take 1 tablet by mouth daily.     No current facility-administered medications for this visit.     REVIEW OF SYSTEMS:   Constitutional: Denies fevers, chills or night sweats Eyes: Denies blurriness of vision Ears, nose, mouth, throat, and face: Denies mucositis or sore throat Respiratory: Denies cough, dyspnea or wheezes Cardiovascular: Denies palpitation, chest discomfort or lower extremity swelling Gastrointestinal:  Denies nausea, heartburn or change in bowel habits Skin: Denies abnormal skin rashes Lymphatics: Denies new lymphadenopathy or easy bruising Neurological:Denies numbness, tingling or new weaknesses Behavioral/Psych: Mood is stable, no new changes  All other systems were reviewed with the patient and are negative.  PHYSICAL EXAMINATION: ECOG PERFORMANCE STATUS: 0 - Asymptomatic  Vitals:   10/26/19 0848  BP: 128/72  Pulse: 85  Resp: 18  Temp: (!) 97.5 F (36.4 C)  SpO2: 100%   There were no vitals filed for this visit.  GENERAL:alert, no distress and comfortable Musculoskeletal:no cyanosis of digits and no clubbing  NEURO: alert & oriented x 3 with fluent speech, no focal motor/sensory deficits  LABORATORY DATA:  I have reviewed the data as listed  No results found for: NA, K, CL, CO2, GLUCOSE, BUN, CREATININE, CALCIUM, PROT, ALBUMIN, AST, ALT, ALKPHOS, BILITOT, GFRNONAA, GFRAA  No results found for: SPEP, UPEP  Lab Results  Component Value Date   WBC 5.0 10/26/2019   NEUTROABS 3.0 10/26/2019   HGB 10.3 (L) 10/26/2019   HCT 33.1 (L) 10/26/2019   MCV 83.4 10/26/2019   PLT 288  10/26/2019      Chemistry   No results found for: NA, K, CL, CO2, BUN, CREATININE, GLU No results found for: CALCIUM, ALKPHOS, AST, ALT, BILITOT

## 2019-11-02 ENCOUNTER — Other Ambulatory Visit: Payer: Self-pay

## 2019-11-02 ENCOUNTER — Inpatient Hospital Stay: Payer: BC Managed Care – PPO

## 2019-11-02 VITALS — BP 126/68 | HR 74 | Temp 98.9°F | Resp 18

## 2019-11-02 DIAGNOSIS — Z79899 Other long term (current) drug therapy: Secondary | ICD-10-CM | POA: Diagnosis not present

## 2019-11-02 DIAGNOSIS — F5089 Other specified eating disorder: Secondary | ICD-10-CM | POA: Diagnosis not present

## 2019-11-02 DIAGNOSIS — D500 Iron deficiency anemia secondary to blood loss (chronic): Secondary | ICD-10-CM

## 2019-11-02 DIAGNOSIS — N920 Excessive and frequent menstruation with regular cycle: Secondary | ICD-10-CM | POA: Diagnosis not present

## 2019-11-02 DIAGNOSIS — R5383 Other fatigue: Secondary | ICD-10-CM | POA: Diagnosis not present

## 2019-11-02 MED ORDER — SODIUM CHLORIDE 0.9 % IV SOLN
Freq: Once | INTRAVENOUS | Status: AC
  Filled 2019-11-02: qty 250

## 2019-11-02 MED ORDER — SODIUM CHLORIDE 0.9 % IV SOLN
300.0000 mg | Freq: Once | INTRAVENOUS | Status: AC
  Administered 2019-11-02: 300 mg via INTRAVENOUS
  Filled 2019-11-02: qty 5

## 2019-11-02 NOTE — Patient Instructions (Signed)

## 2019-11-03 ENCOUNTER — Ambulatory Visit: Payer: BC Managed Care – PPO | Attending: Internal Medicine

## 2019-11-03 DIAGNOSIS — Z23 Encounter for immunization: Secondary | ICD-10-CM

## 2019-11-03 NOTE — Progress Notes (Signed)
   Covid-19 Vaccination Clinic  Name:  Melissa Downs    MRN: [id number] DOB: [birth year]  11/03/2019  Melissa Downs was observed post Covid-19 immunization for 15 minutes without incident. She was provided with Vaccine Information Sheet and instruction to access the V-Safe system.   Melissa Downs was instructed to call 911 with any severe reactions post vaccine: Marland Kitchen Difficulty breathing  . Swelling of face and throat  . A fast heartbeat  . A bad rash all over body  . Dizziness and weakness

## 2020-01-23 DIAGNOSIS — N390 Urinary tract infection, site not specified: Secondary | ICD-10-CM | POA: Diagnosis not present

## 2020-01-31 ENCOUNTER — Inpatient Hospital Stay: Payer: BC Managed Care – PPO

## 2020-01-31 ENCOUNTER — Inpatient Hospital Stay: Payer: BC Managed Care – PPO | Admitting: Hematology and Oncology

## 2020-02-20 ENCOUNTER — Inpatient Hospital Stay: Payer: BC Managed Care – PPO | Admitting: Hematology and Oncology

## 2020-02-20 ENCOUNTER — Telehealth: Payer: Self-pay

## 2020-02-20 ENCOUNTER — Inpatient Hospital Stay: Payer: BC Managed Care – PPO | Attending: Hematology and Oncology

## 2020-02-20 ENCOUNTER — Inpatient Hospital Stay: Payer: BC Managed Care – PPO

## 2020-02-20 NOTE — Telephone Encounter (Signed)
Called regarding missed appts today. Unable to leave a message voicemail full.

## 2020-02-21 ENCOUNTER — Encounter: Payer: Self-pay | Admitting: Hematology and Oncology

## 2020-02-25 DIAGNOSIS — N390 Urinary tract infection, site not specified: Secondary | ICD-10-CM | POA: Diagnosis not present

## 2020-02-25 DIAGNOSIS — R109 Unspecified abdominal pain: Secondary | ICD-10-CM | POA: Diagnosis not present

## 2020-03-18 ENCOUNTER — Other Ambulatory Visit: Payer: Self-pay | Admitting: Physician Assistant

## 2020-03-18 DIAGNOSIS — Z1231 Encounter for screening mammogram for malignant neoplasm of breast: Secondary | ICD-10-CM

## 2020-03-20 ENCOUNTER — Inpatient Hospital Stay: Admission: RE | Admit: 2020-03-20 | Payer: BC Managed Care – PPO | Source: Ambulatory Visit

## 2020-04-06 ENCOUNTER — Emergency Department (HOSPITAL_COMMUNITY): Payer: BC Managed Care – PPO

## 2020-04-06 ENCOUNTER — Emergency Department (HOSPITAL_COMMUNITY)
Admission: EM | Admit: 2020-04-06 | Discharge: 2020-04-06 | Disposition: A | Discharge status: Home / Self Care | Payer: BC Managed Care – PPO | Attending: Emergency Medicine | Admitting: Emergency Medicine

## 2020-04-06 ENCOUNTER — Other Ambulatory Visit: Payer: Self-pay

## 2020-04-06 ENCOUNTER — Encounter (HOSPITAL_COMMUNITY): Payer: Self-pay | Admitting: Emergency Medicine

## 2020-04-06 DIAGNOSIS — D500 Iron deficiency anemia secondary to blood loss (chronic): Secondary | ICD-10-CM

## 2020-04-06 DIAGNOSIS — N920 Excessive and frequent menstruation with regular cycle: Secondary | ICD-10-CM | POA: Diagnosis not present

## 2020-04-06 DIAGNOSIS — N946 Dysmenorrhea, unspecified: Secondary | ICD-10-CM | POA: Diagnosis not present

## 2020-04-06 DIAGNOSIS — R1032 Left lower quadrant pain: Secondary | ICD-10-CM

## 2020-04-06 DIAGNOSIS — D649 Anemia, unspecified: Secondary | ICD-10-CM | POA: Insufficient documentation

## 2020-04-06 DIAGNOSIS — N83292 Other ovarian cyst, left side: Secondary | ICD-10-CM | POA: Diagnosis not present

## 2020-04-06 DIAGNOSIS — N83202 Unspecified ovarian cyst, left side: Secondary | ICD-10-CM | POA: Diagnosis not present

## 2020-04-06 HISTORY — DX: Iron deficiency: E61.1

## 2020-04-06 LAB — CBC WITH DIFFERENTIAL/PLATELET
Abs Immature Granulocytes: 0.03 10*3/uL (ref 0.00–0.07)
Basophils Absolute: 0.1 10*3/uL (ref 0.0–0.1)
Basophils Relative: 1 %
Eosinophils Absolute: 0 10*3/uL (ref 0.0–0.5)
Eosinophils Relative: 0 %
HCT: 25.7 % — ABNORMAL LOW (ref 36.0–46.0)
Hemoglobin: 7.1 g/dL — ABNORMAL LOW (ref 12.0–15.0)
Immature Granulocytes: 0 %
Lymphocytes Relative: 12 %
Lymphs Abs: 1.1 10*3/uL (ref 0.7–4.0)
MCH: 19.8 pg — ABNORMAL LOW (ref 26.0–34.0)
MCHC: 27.6 g/dL — ABNORMAL LOW (ref 30.0–36.0)
MCV: 71.8 fL — ABNORMAL LOW (ref 80.0–100.0)
Monocytes Absolute: 0.6 10*3/uL (ref 0.1–1.0)
Monocytes Relative: 7 %
Neutro Abs: 6.7 10*3/uL (ref 1.7–7.7)
Neutrophils Relative %: 80 %
Platelets: 507 10*3/uL — ABNORMAL HIGH (ref 150–400)
RBC: 3.58 MIL/uL — ABNORMAL LOW (ref 3.87–5.11)
RDW: 18.9 % — ABNORMAL HIGH (ref 11.5–15.5)
WBC: 8.5 10*3/uL (ref 4.0–10.5)
nRBC: 0 % (ref 0.0–0.2)

## 2020-04-06 LAB — COMPREHENSIVE METABOLIC PANEL
ALT: 15 U/L (ref 0–44)
AST: 19 U/L (ref 15–41)
Albumin: 3.9 g/dL (ref 3.5–5.0)
Alkaline Phosphatase: 68 U/L (ref 38–126)
Anion gap: 8 (ref 5–15)
BUN: 7 mg/dL (ref 6–20)
CO2: 24 mmol/L (ref 22–32)
Calcium: 8.8 mg/dL — ABNORMAL LOW (ref 8.9–10.3)
Chloride: 103 mmol/L (ref 98–111)
Creatinine, Ser: 0.78 mg/dL (ref 0.44–1.00)
GFR, Estimated: >60 mL/min (ref >60)
Glucose, Bld: 138 mg/dL — ABNORMAL HIGH (ref 70–99)
Potassium: 3.4 mmol/L — ABNORMAL LOW (ref 3.5–5.1)
Sodium: 135 mmol/L (ref 135–145)
Total Bilirubin: 0.9 mg/dL (ref 0.3–1.2)
Total Protein: 7.2 g/dL (ref 6.5–8.1)

## 2020-04-06 LAB — I-STAT BETA HCG BLOOD, ED (MC, WL, AP ONLY): I-stat hCG, quantitative: <5 m[IU]/mL (ref <5)

## 2020-04-06 LAB — URINALYSIS, ROUTINE W REFLEX MICROSCOPIC
Bacteria, UA: NONE SEEN
Bilirubin Urine: NEGATIVE
Glucose, UA: NEGATIVE mg/dL
Ketones, ur: 20 mg/dL — AB
Nitrite: NEGATIVE
Protein, ur: 100 mg/dL — AB
RBC / HPF: >50 RBC/hpf — ABNORMAL HIGH (ref 0–5)
Specific Gravity, Urine: 1.017 (ref 1.005–1.030)
pH: 5 (ref 5.0–8.0)

## 2020-04-06 LAB — LIPASE, BLOOD: Lipase: 27 U/L (ref 11–51)

## 2020-04-06 MED ORDER — ONDANSETRON HCL 4 MG/2ML IJ SOLN
4.0000 mg | Freq: Once | INTRAMUSCULAR | Status: AC
  Administered 2020-04-06: 4 mg via INTRAVENOUS
  Filled 2020-04-06: qty 2

## 2020-04-06 MED ORDER — ONDANSETRON HCL 4 MG PO TABS: 4.0000 mg | ORAL_TABLET | Freq: Four times a day (QID) | ORAL | 0 refills | Status: DC

## 2020-04-06 MED ORDER — OXYCODONE-ACETAMINOPHEN 5-325 MG PO TABS: 1.0000 | ORAL_TABLET | Freq: Four times a day (QID) | ORAL | 0 refills | Status: DC | PRN

## 2020-04-06 MED ORDER — FERROUS SULFATE 325 (65 FE) MG PO TABS: 325.0000 mg | ORAL_TABLET | Freq: Two times a day (BID) | ORAL | 0 refills | Status: AC

## 2020-04-06 MED ORDER — MORPHINE SULFATE (PF) 4 MG/ML IV SOLN
4.0000 mg | Freq: Once | INTRAVENOUS | Status: AC
Start: 2020-04-06 — End: 2020-04-06
  Administered 2020-04-06: 4 mg via INTRAVENOUS
  Filled 2020-04-06: qty 1

## 2020-04-06 NOTE — ED Provider Notes (Signed)
Patient placed in Quick Look pathway, seen and evaluated   Chief Complaint: Left lower quadrant abdominal pain, near syncope  HPI:   55 year old female presents with left lower quadrant abdominal pain that began yesterday, pain became more severe this morning and she became lightheaded in the shower but did not have syncopal episode.  She reports that she has had this pain previously during her menstrual cycle and this current cycle started on Thursday but this pain is more severe than prior.  ROS: + vomiting, LLQ abdominal pain, vaginal bleeding, near syncope  - diarrhea  Physical Exam:  BP (!) 141/72 (BP Location: Left Arm)   Pulse (!) 102   Temp 98.2 F (36.8 C)   Resp 20   LMP 04/03/2020   SpO2 100%    Gen: Pt appears very uncomfortable and is tearful  Neuro: Awake and Alert  Skin: Warm    Focused Exam: LLQ abdominal tenderness  Initially blood pressure was noted to be in the 80s but on recheck bilaterally patient with blood pressure ranging 299-242 systolic.  Initiation of care has begun. The patient has been counseled on the process, plan, and necessity for staying for the completion/evaluation, and the remainder of the medical screening examination  MSE was initiated and I personally evaluated the patient and placed orders (if any) at  10:09 AM on April 06, 2020.  The patient appears stable so that the remainder of the MSE may be completed by another provider.   Jacqlyn Larsen, PA-C 04/06/20 1016    Lennice Sites, DO 04/06/20 1026

## 2020-04-06 NOTE — ED Provider Notes (Signed)
[facility] EMERGENCY DEPARTMENT Provider Note   CSN: [id number] Arrival date & time: 04/06/20  1005     History Chief Complaint  Patient presents with  . Abdominal Pain    Melissa Downs is a 55 y.o. female.  Patient is a 55 year old female with a history of iron deficiency anemia due to menorrhagia who is presenting today with severe left lower quadrant/pelvic abdominal pain.  Patient reports that for the last 4 cycles she has started to have discomfort with menses.  She reports always having very heavy flow with her menses for 2 to 3 days but had never had pain until the last 4 cycles.  She reports her cycle before this when she had severe pain in her lower abdomen that does have some improvement with ibuprofen and a heating pad but became so severe it even caused her to vomit.  She does not report that the bleeding is really any heavier or longer than normal.  This menses started 4 days ago and she has had ongoing pain mostly in the left lower quadrant and pelvic area.  Movement makes it worse it is sharp and stabbing in nature.  It does not radiate but when she lays on her right side it does move a little bit into the right.  She has had no change in bowel movements or urinary habits.  When the pain is intense it causes her to vomit.  She did have 1 episode of emesis yesterday and one today.  The history is provided by the patient and medical records.  Abdominal Pain      Past Medical History:  Diagnosis Date  . Anemia   . Iron deficiency     Patient Active Problem List   Diagnosis Date Noted  . Iron deficiency anemia due to chronic blood loss 09/07/2019  . Deficiency anemia 09/07/2019  . Menorrhagia, premenopausal 09/07/2019    History reviewed. No pertinent surgical history.   OB History   No obstetric history on file.     No family history on file.  Social History   Tobacco Use  . Smoking status: Never Smoker  . Smokeless tobacco: Never  Used  Substance Use Topics  . Alcohol use: Not Currently  . Drug use: Not Currently    Home Medications Prior to Admission medications   Medication Sig Start Date End Date Taking? Authorizing Provider  Azelastine-Fluticasone (DYMISTA) 137-50 MCG/ACT SUSP Place 137 mcg into the nose.    [provider]  cetirizine (ZYRTEC) 10 MG tablet Take 10 mg by mouth daily.    [provider]  CYCLOBENZAPRINE HCL PO Take by mouth as needed.    [provider]  DICLOFENAC PO Take by mouth as needed.    [provider]  montelukast (SINGULAIR) 10 MG tablet Take 10 mg by mouth at bedtime.    [provider]  Multiple Vitamin (MULTIVITAMIN) tablet Take 1 tablet by mouth daily.    [provider]    Allergies    Patient has no known allergies.  Review of Systems   Review of Systems  Gastrointestinal: Positive for abdominal pain.  All other systems reviewed and are negative.   Physical Exam Updated Vital Signs BP (!) 141/72 (BP Location: Left Arm)   Pulse (!) 102   Temp 98.2 F (36.8 C)   Resp 20   LMP 04/03/2020   SpO2 100%   Physical Exam Vitals and nursing note reviewed.  Constitutional:  General: She is not in acute distress.    Appearance: She is well-developed.  HENT:     Head: Normocephalic and atraumatic.  Eyes:     Pupils: Pupils are equal, round, and reactive to light.  Cardiovascular:     Rate and Rhythm: Normal rate and regular rhythm.     Heart sounds: Normal heart sounds. No murmur heard. No friction rub.  Pulmonary:     Effort: Pulmonary effort is normal.     Breath sounds: Normal breath sounds. No wheezing or rales.  Abdominal:     General: Bowel sounds are normal. There is no distension.     Palpations: Abdomen is soft.     Tenderness: There is abdominal tenderness in the left lower quadrant. There is guarding. There is no rebound.  Musculoskeletal:        General: No tenderness. Normal range of motion.      Comments: No edema  Skin:    General: Skin is warm and dry.     Findings: No rash.  Neurological:     Mental Status: She is alert and oriented to person, place, and time.     Cranial Nerves: No cranial nerve deficit.  Psychiatric:        Behavior: Behavior normal.     ED Results / Procedures / Treatments   Labs (all labs ordered are listed, but only abnormal results are displayed) Labs Reviewed  CBC WITH DIFFERENTIAL/PLATELET - Abnormal; Notable for the following components:      Result Value   RBC 3.58 (*)    Hemoglobin 7.1 (*)    HCT 25.7 (*)    MCV 71.8 (*)    MCH 19.8 (*)    MCHC 27.6 (*)    RDW 18.9 (*)    Platelets 507 (*)    All other components within normal limits  COMPREHENSIVE METABOLIC PANEL - Abnormal; Notable for the following components:   Potassium 3.4 (*)    Glucose, Bld 138 (*)    Calcium 8.8 (*)    All other components within normal limits  URINALYSIS, ROUTINE W REFLEX MICROSCOPIC - Abnormal; Notable for the following components:   APPearance HAZY (*)    Hgb urine dipstick LARGE (*)    Ketones, ur 20 (*)    Protein, ur 100 (*)    Leukocytes,Ua MODERATE (*)    RBC / HPF >50 (*)    All other components within normal limits  LIPASE, BLOOD  I-STAT BETA HCG BLOOD, ED ([facility], [facility], [facility] ONLY)    EKG None  Radiology US PELVIC COMPLETE W TRANSVAGINAL AND TORSION R/O  Result Date: 04/06/2020 CLINICAL DATA:  Left lower quadrant pain 3 days. EXAM: TRANSABDOMINAL AND TRANSVAGINAL ULTRASOUND OF PELVIS DOPPLER ULTRASOUND OF OVARIES TECHNIQUE: Both transabdominal and transvaginal ultrasound examinations of the pelvis were performed. Transabdominal technique was performed for global imaging of the pelvis including uterus, ovaries, adnexal regions, and pelvic cul-de-sac. It was necessary to proceed with endovaginal exam following the transabdominal exam to visualize the endometrium and ovaries. Color and duplex Doppler ultrasound was utilized to evaluate blood flow to  the ovaries. COMPARISON:  None. FINDINGS: Uterus Measurements: 14.3 x 8.8 x 9.2 cm = volume: 607 mL. 2.7 cm fibroid over the right side of the uterine fundus. Small nabothian cyst present. Endometrium Thickness: 22.6 mm.  No focal abnormality visualized. Right ovary Measurements: 5.6 x 2.5 x 3.1 cm = volume: 22 mL. Normal appearance/no adnexal mass. Left ovary Measurements: 6.3 x 3.0 x 4.3 cm =  volume: 47 mL. Normal appearance/no adnexal mass. 3.1 cm simple cyst. Pulsed Doppler evaluation of both ovaries demonstrates normal low-resistance arterial and venous waveforms. Other findings No abnormal free fluid. IMPRESSION: 1. Mildly enlarged uterus demonstrating homogeneously thickened endometrium measuring 22.6 mm. No focal endometrial abnormality identified. Recommend gyn consultation. In the setting of post-menopausal bleeding, endometrial sampling is indicated to exclude carcinoma. If results are benign, sonohysterogram should be considered for focal lesion work-up. (Ref: Radiological Reasoning: Algorithmic Workup of Abnormal Vaginal Bleeding with Endovaginal Sonography and Sonohysterography. AJR 2008; 224:S97-53) 2. Ovaries normal size with normal vascularity. 3.1 cm simple left ovarian cyst. Recommend followup US in 3-6 months. Note: This recommendation does not apply to premenarchal patients or to those with increased risk (genetic, family history, elevated tumor markers or other high-risk factors) of ovarian cancer. Reference: Radiology 2019 Nov; 293(2):359-371. Electronically Signed   By: Marin Olp M.D.   On: 04/06/2020 12:41    Procedures Procedures   Medications Ordered in ED Medications  morphine 4 MG/ML injection 4 mg (has no administration in time range)  ondansetron (ZOFRAN) injection 4 mg (has no administration in time range)    ED Course  I have reviewed the triage vital signs and the nursing notes.  Pertinent labs & imaging results that were available during my care of the patient  were reviewed by me and considered in my medical decision making (see chart for details).    MDM Rules/Calculators/A&P                          Patient presenting today with severe pain in the left pelvis associated with the beginning of her menses 4 days ago.  The pain during menses has become relatively new only present over the last 3-4 cycles.  She is always had menorrhagia and that has not changed significantly.  She does get iron infusions and she reports the last infusion she had was approximately 3 months ago.  She did note today while in the shower she began feeling lightheaded and almost syncopized.  She however has not been feeling ongoing dizziness, chest pain or shortness of breath.  She reports that the bleeding is starting to become lighter.  Hemoglobin today is 7.1 from 10 in October.  She reports that she can drop as low as 5 which is usually when she gets an iron transfusion.  hCG is negative and CMP is within normal limits.  Patient given pain control.  Ultrasound to evaluate for torsion or other acute pelvic pathology is pending.  2:05 PM UA does have a large amount of blood and some white blood cells with no bacteria and patient is not having any urinary complaints.  Ultrasound shows a mildly enlarged uterus demonstrating homogeneously thickened endometrium of 22.6 mm however patient is currently still menstruating and has not gone through menopause yet.  Also the ovaries are normal in size with normal vascularity but a 3.1 cm simple cyst is present on the left ovary which they recommended follow-up for.  After 1 dose of pain medication patient is feeling significantly better.  Low suspicion at this time for bowel obstruction, perforation or an acute vascular issue.  Low suspicion for kidney stone.  Suspect this is all GYN in nature.  Patient has called her OB/GYN on Thursday and plans on calling again on Monday to ensure a follow-up visit.  She has a specialist that she sees about her  anemia and gets iron transfusions.  She  will call them on Monday as well.  She has not had consistent dizziness, near syncope or weakness it was just the episode today associated with the pain and nausea.  Patient was given a short prescription for pain medication and nausea medication.  She was given return precautions.  MDM Number of Diagnoses or Management Options   Amount and/or Complexity of Data Reviewed Clinical lab tests: ordered and reviewed Tests in the radiology section of CPT: ordered and reviewed Decide to obtain previous medical records or to obtain history from someone other than the patient: yes Obtain history from someone other than the patient: no Review and summarize past medical records: yes Discuss the patient with other providers: no Independent visualization of images, tracings, or specimens: yes  Risk of Complications, Morbidity, and/or Mortality Presenting problems: moderate Diagnostic procedures: moderate Management options: moderate  Patient Progress Patient progress: improved   Final Clinical Impression(s) / ED Diagnoses Final diagnoses:  LLQ abdominal pain  Left ovarian cyst  Blood loss anemia  Dysmenorrhea, unspecified  Menorrhagia with regular cycle    Rx / DC Orders ED Discharge Orders         Ordered    ondansetron (ZOFRAN) 4 MG tablet  Every 6 hours        04/06/20 1413    oxyCODONE-acetaminophen (PERCOCET/ROXICET) 5-325 MG tablet  Every 6 hours PRN        04/06/20 1415    ferrous sulfate 325 (65 FE) MG tablet  2 times daily with meals        04/06/20 1422           Blanchie Dessert, MD 04/06/20 1603

## 2020-04-06 NOTE — ED Triage Notes (Addendum)
C/o LLQ pain since yesterday with vomiting x 3.  States she is on her menstrual cycle and has had similar pain while on her cycle before.  Denies diarrhea.  States she felt like she was going to pass out in the shower this morning.

## 2020-04-06 NOTE — Discharge Instructions (Signed)
A prescription for nausea medicine and pain medicine was sent to your pharmacy.  You can also continue to use heat and ibuprofen and just use the pain medicine for severe pain.  If you start having severe weakness, shortness of breath, any passing out prior to being able to see your doctors and get the iron transfusion please return to the emergency room.

## 2020-04-08 DIAGNOSIS — D259 Leiomyoma of uterus, unspecified: Secondary | ICD-10-CM | POA: Diagnosis not present

## 2020-04-08 DIAGNOSIS — N939 Abnormal uterine and vaginal bleeding, unspecified: Secondary | ICD-10-CM | POA: Diagnosis not present

## 2020-04-08 DIAGNOSIS — N946 Dysmenorrhea, unspecified: Secondary | ICD-10-CM | POA: Diagnosis not present

## 2020-04-08 DIAGNOSIS — N83202 Unspecified ovarian cyst, left side: Secondary | ICD-10-CM | POA: Diagnosis not present

## 2020-04-08 DIAGNOSIS — E049 Nontoxic goiter, unspecified: Secondary | ICD-10-CM | POA: Diagnosis not present

## 2020-04-08 DIAGNOSIS — Z124 Encounter for screening for malignant neoplasm of cervix: Secondary | ICD-10-CM | POA: Diagnosis not present

## 2020-04-11 ENCOUNTER — Ambulatory Visit: Payer: BC Managed Care – PPO

## 2020-04-29 DIAGNOSIS — N946 Dysmenorrhea, unspecified: Secondary | ICD-10-CM | POA: Diagnosis not present

## 2020-04-29 DIAGNOSIS — N920 Excessive and frequent menstruation with regular cycle: Secondary | ICD-10-CM | POA: Diagnosis not present

## 2020-05-20 DIAGNOSIS — H35052 Retinal neovascularization, unspecified, left eye: Secondary | ICD-10-CM | POA: Diagnosis not present

## 2020-05-20 DIAGNOSIS — H43823 Vitreomacular adhesion, bilateral: Secondary | ICD-10-CM | POA: Diagnosis not present

## 2020-05-20 DIAGNOSIS — H31092 Other chorioretinal scars, left eye: Secondary | ICD-10-CM | POA: Diagnosis not present

## 2020-05-29 ENCOUNTER — Encounter: Payer: Self-pay | Admitting: Hematology and Oncology

## 2020-05-29 ENCOUNTER — Other Ambulatory Visit: Payer: Self-pay

## 2020-05-29 ENCOUNTER — Ambulatory Visit
Admission: RE | Admit: 2020-05-29 | Discharge: 2020-05-29 | Disposition: A | Discharge status: Home / Self Care | Payer: BC Managed Care – PPO | Source: Ambulatory Visit | Attending: Physician Assistant | Admitting: Physician Assistant

## 2020-05-29 DIAGNOSIS — Z1231 Encounter for screening mammogram for malignant neoplasm of breast: Secondary | ICD-10-CM

## 2020-06-03 DIAGNOSIS — H35052 Retinal neovascularization, unspecified, left eye: Secondary | ICD-10-CM | POA: Diagnosis not present

## 2020-07-01 DIAGNOSIS — H35052 Retinal neovascularization, unspecified, left eye: Secondary | ICD-10-CM | POA: Diagnosis not present

## 2020-07-01 DIAGNOSIS — H43823 Vitreomacular adhesion, bilateral: Secondary | ICD-10-CM | POA: Diagnosis not present

## 2020-07-29 DIAGNOSIS — H43823 Vitreomacular adhesion, bilateral: Secondary | ICD-10-CM | POA: Diagnosis not present

## 2020-07-29 DIAGNOSIS — H35052 Retinal neovascularization, unspecified, left eye: Secondary | ICD-10-CM | POA: Diagnosis not present

## 2020-09-01 DIAGNOSIS — H31092 Other chorioretinal scars, left eye: Secondary | ICD-10-CM | POA: Diagnosis not present

## 2020-09-01 DIAGNOSIS — H35052 Retinal neovascularization, unspecified, left eye: Secondary | ICD-10-CM | POA: Diagnosis not present

## 2020-09-01 DIAGNOSIS — H43823 Vitreomacular adhesion, bilateral: Secondary | ICD-10-CM | POA: Diagnosis not present

## 2020-10-13 DIAGNOSIS — H35052 Retinal neovascularization, unspecified, left eye: Secondary | ICD-10-CM | POA: Diagnosis not present

## 2020-10-13 DIAGNOSIS — H31092 Other chorioretinal scars, left eye: Secondary | ICD-10-CM | POA: Diagnosis not present

## 2020-10-13 DIAGNOSIS — H43823 Vitreomacular adhesion, bilateral: Secondary | ICD-10-CM | POA: Diagnosis not present

## 2020-12-02 DIAGNOSIS — H43823 Vitreomacular adhesion, bilateral: Secondary | ICD-10-CM | POA: Diagnosis not present

## 2020-12-02 DIAGNOSIS — H35052 Retinal neovascularization, unspecified, left eye: Secondary | ICD-10-CM | POA: Diagnosis not present

## 2020-12-02 DIAGNOSIS — H31092 Other chorioretinal scars, left eye: Secondary | ICD-10-CM | POA: Diagnosis not present

## 2020-12-25 DIAGNOSIS — Z13220 Encounter for screening for lipoid disorders: Secondary | ICD-10-CM | POA: Diagnosis not present

## 2020-12-25 DIAGNOSIS — G5601 Carpal tunnel syndrome, right upper limb: Secondary | ICD-10-CM | POA: Diagnosis not present

## 2020-12-25 DIAGNOSIS — Z0000 Encounter for general adult medical examination without abnormal findings: Secondary | ICD-10-CM | POA: Diagnosis not present

## 2020-12-25 DIAGNOSIS — D509 Iron deficiency anemia, unspecified: Secondary | ICD-10-CM | POA: Diagnosis not present

## 2020-12-25 DIAGNOSIS — J309 Allergic rhinitis, unspecified: Secondary | ICD-10-CM | POA: Diagnosis not present

## 2021-03-14 ENCOUNTER — Encounter (HOSPITAL_COMMUNITY): Payer: Self-pay | Admitting: *Deleted

## 2021-03-14 ENCOUNTER — Emergency Department (HOSPITAL_COMMUNITY): Payer: BC Managed Care – PPO

## 2021-03-14 ENCOUNTER — Other Ambulatory Visit: Payer: Self-pay

## 2021-03-14 ENCOUNTER — Emergency Department (HOSPITAL_COMMUNITY)
Admission: EM | Admit: 2021-03-14 | Discharge: 2021-03-14 | Disposition: A | Discharge status: Home / Self Care | Payer: BC Managed Care – PPO | Attending: Emergency Medicine | Admitting: Emergency Medicine

## 2021-03-14 DIAGNOSIS — M545 Low back pain, unspecified: Secondary | ICD-10-CM | POA: Insufficient documentation

## 2021-03-14 DIAGNOSIS — R42 Dizziness and giddiness: Secondary | ICD-10-CM | POA: Diagnosis not present

## 2021-03-14 DIAGNOSIS — N939 Abnormal uterine and vaginal bleeding, unspecified: Secondary | ICD-10-CM | POA: Diagnosis not present

## 2021-03-14 DIAGNOSIS — R110 Nausea: Secondary | ICD-10-CM | POA: Insufficient documentation

## 2021-03-14 DIAGNOSIS — N8500 Endometrial hyperplasia, unspecified: Secondary | ICD-10-CM | POA: Diagnosis not present

## 2021-03-14 DIAGNOSIS — N938 Other specified abnormal uterine and vaginal bleeding: Secondary | ICD-10-CM | POA: Diagnosis not present

## 2021-03-14 DIAGNOSIS — D251 Intramural leiomyoma of uterus: Secondary | ICD-10-CM | POA: Diagnosis not present

## 2021-03-14 DIAGNOSIS — M549 Dorsalgia, unspecified: Secondary | ICD-10-CM

## 2021-03-14 HISTORY — DX: Unspecified ovarian cyst, unspecified side: N83.209

## 2021-03-14 LAB — CBC WITH DIFFERENTIAL/PLATELET
Abs Immature Granulocytes: 0.04 10*3/uL (ref 0.00–0.07)
Basophils Absolute: 0 10*3/uL (ref 0.0–0.1)
Basophils Relative: 0 %
Eosinophils Absolute: 0 10*3/uL (ref 0.0–0.5)
Eosinophils Relative: 0 %
HCT: 30.9 % — ABNORMAL LOW (ref 36.0–46.0)
Hemoglobin: 9 g/dL — ABNORMAL LOW (ref 12.0–15.0)
Immature Granulocytes: 0 %
Lymphocytes Relative: 11 %
Lymphs Abs: 1.2 10*3/uL (ref 0.7–4.0)
MCH: 23.9 pg — ABNORMAL LOW (ref 26.0–34.0)
MCHC: 29.1 g/dL — ABNORMAL LOW (ref 30.0–36.0)
MCV: 82 fL (ref 80.0–100.0)
Monocytes Absolute: 0.7 10*3/uL (ref 0.1–1.0)
Monocytes Relative: 6 %
Neutro Abs: 9.2 10*3/uL — ABNORMAL HIGH (ref 1.7–7.7)
Neutrophils Relative %: 83 %
Platelets: 271 10*3/uL (ref 150–400)
RBC: 3.77 MIL/uL — ABNORMAL LOW (ref 3.87–5.11)
RDW: 19.8 % — ABNORMAL HIGH (ref 11.5–15.5)
WBC: 11.2 10*3/uL — ABNORMAL HIGH (ref 4.0–10.5)
nRBC: 0 % (ref 0.0–0.2)

## 2021-03-14 LAB — I-STAT BETA HCG BLOOD, ED (MC, WL, AP ONLY): I-stat hCG, quantitative: <5 m[IU]/mL (ref <5)

## 2021-03-14 MED ORDER — ONDANSETRON HCL 4 MG/2ML IJ SOLN
4.0000 mg | Freq: Once | INTRAMUSCULAR | Status: AC
  Administered 2021-03-14: 4 mg via INTRAVENOUS
  Filled 2021-03-14: qty 2

## 2021-03-14 MED ORDER — HYDROMORPHONE HCL 1 MG/ML IJ SOLN
1.0000 mg | Freq: Once | INTRAMUSCULAR | Status: AC
  Administered 2021-03-14: 1 mg via INTRAVENOUS
  Filled 2021-03-14: qty 1

## 2021-03-14 MED ORDER — ESTROGENS CONJUGATED 25 MG IJ SOLR
25.0000 mg | Freq: Once | INTRAMUSCULAR | Status: AC
  Administered 2021-03-14: 25 mg via INTRAVENOUS
  Filled 2021-03-14 (×2): qty 25

## 2021-03-14 MED ORDER — SODIUM CHLORIDE 0.9 % IV BOLUS
500.0000 mL | Freq: Once | INTRAVENOUS | Status: AC
  Administered 2021-03-14: 500 mL via INTRAVENOUS

## 2021-03-14 MED ORDER — MEGESTROL ACETATE 40 MG PO TABS: 40.0000 mg | ORAL_TABLET | Freq: Two times a day (BID) | ORAL | 1 refills | Status: AC

## 2021-03-14 MED ORDER — STERILE WATER FOR INJECTION IJ SOLN
INTRAMUSCULAR | Status: AC
  Administered 2021-03-14: 5 mL
  Filled 2021-03-14: qty 10

## 2021-03-14 NOTE — ED Provider Notes (Signed)
?  Care of patient assumed from [place] at 1600.  Agree with history, physical exam and plan.  See their note for further details. Briefly, 56 year old female with a history of uterine fibroids presents to the emergency department with increased vaginal bleeding x1 week. ? ? ?Physical Exam  ?BP 124/78   Pulse 79   Temp 98.1 ?F (36.7 ?C)   Resp 20   Ht '5\' 4"'$  (1.626 m)   Wt 81.2 kg   SpO2 100%   BMI 30.73 kg/m?  ? ?Physical Exam ?Vitals and nursing note reviewed.  ?Constitutional:   ?   General: She is not in acute distress. ?   Appearance: She is not ill-appearing, toxic-appearing or diaphoretic.  ?HENT:  ?   Head: Normocephalic.  ?Eyes:  ?   General: No scleral icterus.    ?   Right eye: No discharge.     ?   Left eye: No discharge.  ?Cardiovascular:  ?   Rate and Rhythm: Normal rate.  ?Pulmonary:  ?   Effort: Pulmonary effort is normal.  ?Abdominal:  ?   General: There is no distension. There are no signs of injury.  ?   Palpations: Abdomen is soft. There is no mass or pulsatile mass.  ?   Tenderness: There is no abdominal tenderness. There is no guarding or rebound.  ?Skin: ?   General: Skin is warm and dry.  ?Neurological:  ?   General: No focal deficit present.  ?   Mental Status: She is alert.  ?Psychiatric:     ?   Behavior: Behavior is cooperative.  ? ? ?Procedures  ?Procedures ? ?ED Course / MDM  ?  ?Medical Decision Making ?Amount and/or Complexity of Data Reviewed ?Labs: ordered. ? ? ?At time of handoff patient is awaiting administration of Premarin.  OB/GYN consult was obtained by previous provider.  They recommended giving patient IV Premarin in the emergency department and starting her on days.  Patient is to follow-up with her OB/GYN provider in the outpatient setting. ? ?My examination patient is in no acute distress.  Reports resolution in pain after receiving medication.  Abdomen soft, nondistended, nontender, no guarding or rebound tenderness.  Patient is hemodynamically stable.  Will  discharge patient at this time. ? ?Discussed results, findings, treatment and follow up. Patient advised of return precautions. Patient verbalized understanding and agreed with plan. ? ? ? ? ? ?  ?Loni Beckwith, PA-C ?03/14/21 1755 ? ?  ?Davonna Belling, MD ?03/14/21 2105 ? ?

## 2021-03-14 NOTE — ED Triage Notes (Signed)
Pt states profuse vaginal bleeding and LLQ pain since this am.  She took a medication to slow the bleeding down, but the bleeding decreased minimally.  Pain radiates from LLQ to L flank. ? ?Hx of L ovarian cyst. ?

## 2021-03-14 NOTE — ED Notes (Signed)
Discharge instructions, follow up care, and prescriptions reviewed and explained, pt verbalized understanding.  

## 2021-03-14 NOTE — Discharge Instructions (Addendum)
Please follow-up with your OB/GYN in the next 3 to 5 days.  If you are unable to see them or need an OB/GYN, contact information for the [facility] service has been provided for you that you may utilize ? ?Please follow-up with your primary care within the next week for continued medical management ? ?Return to the ED for new or worsening symptoms as discussed ?

## 2021-03-14 NOTE — ED Notes (Signed)
Pt still at ultrasound

## 2021-03-14 NOTE — ED Provider Notes (Signed)
[facility] EMERGENCY DEPARTMENT Provider Note   CSN: [id number] Arrival date & time: 03/14/21  1224     History  Chief Complaint  Patient presents with   Vaginal Bleeding    Melissa Downs is a 56 y.o. female presenting today with 1 week of increasing vaginal bleeding.  Patient states she is premenopausal.  Had similar presentation 1 year ago was diagnosed with an ovarian cyst on the left side and also fibroid in her uterus.  Was advised to have the fibroid removed, but then the patient did not experience bleeding for 8 months so she decided to hold off.  As of this morning, pain now rated a 9/10 with radiation to her left back.  Today the vaginal bleeding went from a steady flow to now "gushing out of her vagina".  Took an unknown medication recommended by her OB/GYN to reduce the bleeding.  Endorses nausea but denies vomiting.  Denies constipation, diarrhea, urinary symptoms.  Denies fever.  Endorses lightheadedness/dizziness over the last 24 hours.  Currently being treated for iron deficiency anemia, and has received iron infusions in the past.  No prior history of blood transfusions.    The history is provided by the patient and medical records.  Abdominal Pain Associated symptoms: vaginal bleeding       Home Medications Prior to Admission medications   Medication Sig Start Date End Date Taking? Authorizing Provider  megestrol (MEGACE) 40 MG tablet Take 1 tablet (40 mg total) by mouth 2 (two) times daily. 03/14/21 04/13/21 Yes Prince Rome, PA-C  acetaminophen (TYLENOL) 325 MG tablet Take 325-650 mg by mouth every 6 (six) hours as needed for mild pain (or headaches).    [provider]  Azelastine-Fluticasone 137-50 MCG/ACT SUSP Place 1-2 sprays into both nostrils daily as needed (for allergies).    [provider]  cetirizine (ZYRTEC) 10 MG tablet Take 10 mg by mouth daily.    [provider]  ferrous sulfate 325 (65 FE) MG  tablet Take 1 tablet (325 mg total) by mouth 2 (two) times daily with a meal. 04/06/20   Blanchie Dessert, MD  ibuprofen (ADVIL) 800 MG tablet Take 800 mg by mouth every 8 (eight) hours as needed for mild pain (or headaches).    [provider]  Iron-FA-B Cmp-C-Biot-Probiotic (FUSION PLUS) CAPS Take 1 capsule by mouth daily.    [provider]  ondansetron (ZOFRAN) 4 MG tablet Take 1 tablet (4 mg total) by mouth every 6 (six) hours. 04/06/20   Blanchie Dessert, MD  oxyCODONE-acetaminophen (PERCOCET/ROXICET) 5-325 MG tablet Take 1 tablet by mouth every 6 (six) hours as needed for severe pain. 04/06/20   Eustaquio Maize, PA-C      Allergies    Coconut oil and Other    Review of Systems   Review of Systems  Gastrointestinal:  Positive for abdominal pain.  Genitourinary:  Positive for vaginal bleeding. Negative for vaginal pain.  Musculoskeletal:  Positive for back pain.  Neurological:  Positive for light-headedness.   Physical Exam Updated Vital Signs BP 124/78    Pulse 79    Temp 98.1 F (36.7 C)    Resp 20    Ht '5\' 4"'$  (1.626 m)    Wt 81.2 kg    SpO2 100%    BMI 30.73 kg/m  Physical Exam Vitals and nursing note reviewed. Exam conducted with a chaperone present.  Constitutional:      General: She is not in acute distress.  Appearance: She is well-developed.  HENT:     Head: Normocephalic and atraumatic.  Eyes:     General: No scleral icterus.    Conjunctiva/sclera: Conjunctivae normal.  Cardiovascular:     Rate and Rhythm: Normal rate and regular rhythm.     Heart sounds: No murmur heard. Pulmonary:     Effort: Pulmonary effort is normal. No respiratory distress.     Breath sounds: Normal breath sounds.  Abdominal:     Palpations: Abdomen is soft.     Tenderness: There is abdominal tenderness in the left lower quadrant. There is no guarding or rebound. Negative signs include Murphy's sign and McBurney's sign.  Genitourinary:    General: Normal vulva.     Exam  position: Lithotomy position.     Vagina: No signs of injury. Bleeding present. No tenderness or prolapsed vaginal walls.     Cervix: Cervical bleeding present. No cervical motion tenderness.     Uterus: Tender (Mild). No uterine prolapse.      Adnexa: Right adnexa normal and left adnexa normal.     Comments: Copious amounts of blood observed in the vaginal canal Musculoskeletal:        General: No swelling.       Arms:     Cervical back: Normal and neck supple.     Thoracic back: Normal.     Lumbar back: Tenderness present.  Skin:    General: Skin is warm and dry.     Capillary Refill: Capillary refill takes less than 2 seconds.  Neurological:     Mental Status: She is alert.  Psychiatric:        Mood and Affect: Mood normal.    ED Results / Procedures / Treatments   Labs (all labs ordered are listed, but only abnormal results are displayed) Labs Reviewed  CBC WITH DIFFERENTIAL/PLATELET - Abnormal; Notable for the following components:      Result Value   WBC 11.2 (*)    RBC 3.77 (*)    Hemoglobin 9.0 (*)    HCT 30.9 (*)    MCH 23.9 (*)    MCHC 29.1 (*)    RDW 19.8 (*)    Neutro Abs 9.2 (*)    All other components within normal limits  I-STAT BETA HCG BLOOD, ED ([facility], [facility], [facility] ONLY)    EKG None  Radiology US PELVIC COMPLETE W TRANSVAGINAL AND TORSION R/O  Result Date: 03/14/2021 CLINICAL DATA:  Vaginal bleed and Back pain. Last menstrual period 03/08/2021 EXAM: TRANSABDOMINAL AND TRANSVAGINAL ULTRASOUND OF PELVIS DOPPLER ULTRASOUND OF OVARIES TECHNIQUE: Both transabdominal and transvaginal ultrasound examinations of the pelvis were performed. Transabdominal technique was performed for global imaging of the pelvis including uterus, ovaries, adnexal regions, and pelvic cul-de-sac. It was necessary to proceed with endovaginal exam following the transabdominal exam to visualize the endometrium and ovaries. Color and duplex Doppler ultrasound was utilized to evaluate blood flow  to the ovaries. COMPARISON:  None. FINDINGS: Uterus Measurements: 70.2 x 9.6 x 10.8 cm = volume: 936 mL. Intramural anterior right uterine fibroids measuring 6.5 x 5.9 x 6 cm and 1.5 x 1.5 x 1.4 cm. Endometrium Thickness: 23 mm.  No focal abnormality visualized. Right ovary Measurements: 3.9 x 3 x 2.4 cm = volume: 15 mL. Normal appearance/no adnexal mass. Left ovary Measurements: 5.9 x 2.5 x 2.8 cm = volume: 22 mL. Normal appearance/no adnexal mass. Pulsed Doppler evaluation of both ovaries demonstrates normal low-resistance arterial and venous waveforms. Other findings No abnormal free fluid.  IMPRESSION: 1. Thickened endometrium measuring up to 23 mm. Underlying malignancy cannot be excluded. Recommend gynecologic consultation. 2. Intramural uterine fibroids measuring up to 6.5 cm. Electronically Signed   By: Iven Finn M.D.   On: 03/14/2021 15:32    Procedures Procedures    Medications Ordered in ED Medications  conjugated estrogens (PREMARIN) injection 25 mg (has no administration in time range)  sterile water (preservative free) injection (has no administration in time range)  HYDROmorphone (DILAUDID) injection 1 mg (1 mg Intravenous Given 03/14/21 1605)  sodium chloride 0.9 % bolus 500 mL (500 mLs Intravenous New Bag/Given 03/14/21 1607)  ondansetron (ZOFRAN) injection 4 mg (4 mg Intravenous Given 03/14/21 1601)    ED Course/ Medical Decision Making/ A&P                           Medical Decision Making Amount and/or Complexity of Data Reviewed External Data Reviewed: notes. Labs: ordered. Decision-making details documented in ED Course. Radiology: ordered and independent interpretation performed. Decision-making details documented in ED Course. ECG/medicine tests: ordered and independent interpretation performed. Decision-making details documented in ED Course.  Risk Prescription drug management.   56 y.o. female presents to the ED for concern of Vaginal Bleeding  .  This  involves an extensive number of treatment options, and is a complaint that carries with it a high risk of complications and morbidity.  The differential diagnosis includes fibroid, ovarian cyst rupture, ovarian torsion, tubular ovarian abscess, ectopic pregnancy  Comorbidities that complicate the patient evaluation include iron deficiency anemia  Additional history obtained from internal/external records available via epic  Interpretation: I ordered, and personally interpreted labs.  The pertinent results include: Mild elevation in WBC at 11.2 which could be inflammatory or infectious process.  Decreased hemoglobin and hematocrit of 9.0 and 30.9, which is a little bit lower than her normal range even though she has a history of iron deficiency anemia.  I ordered imaging studies including ultrasounds of the pelvis with and without Doppler.  I independently visualized and interpreted imaging which showed thickened endometrium measuring up to 23 mm and intramural uterine fibroids measuring up to 6.5 cm.  I agree with the radiologist interpretation  I considered ordering abdominal imaging, but patient had no red flag symptoms or specific complaints to suggest other abdominal pathology such as what could be assessed with the imaging ordered  Intervention: I ordered medication including IV fluids, antiemetics, and pain medication for fluid replenishment, pain management, and nausea reduction.  Reevaluation of the patient after these medicines showed that the patient mild to moderate improvement.  I have reviewed the patients home medicines and have made adjustments as needed  I requested consultation with the on-call OB/GYN physician,  and discussed lab and imaging findings as well as pertinent plan - they recommend: 25 mg of IV Premarin in the ED and for the patient to go home on Megace 40 mg twice daily for the next month with 1 refill and urgent follow up with OBGYN.  ED Course: Patient presented  with vaginal bleeding, LLQ pain and left back pain, lightheadedness, nausea.  Patient had similar presentation one year ago and was diagnosed with left ovarian cyst and leiomyoma.  At that time it was recommended that she have the leiomyoma removed by her OB/GYN.  Since patient did not bleed for 8 more months after the incident, she decided to hold off.  History of 2 vaginal deliveries years ago.  On physical  exam patient is well-appearing but appears to be in pain.  During physical exam, patient was wearing pad and had tampon in.  When these were removed they were soaked with blood.  Speculum exam shows copious blood present in the vaginal canal with active hemorrhage through the cervical os.  Considered ectopic pregnancy but negative BhCG and imaging.  On bimanual pelvic exam, tenderness noted with left ovary, and some mild discomfort of the uterus.  Patient complaining of significant left-sided back pain that appears to be worse with certain position.  Less suspicious of TOA, as no known history of pelvic inflammatory disease, no physical exam findings of cervical motion tenderness, and patient is afebrile.  Vaginal discharge does not appear to have purulence, appears to be hemorrhagic.  Imaging indicates leiomyoma presence and thickened endometrium.  Imaging does not support cyst rupture.  Malignancy cannot be excluded via imaging or exam.  Urgent follow-up is recommended for further evaluation and continued management.    Disposition: I discussed the patient and their case with my attending, Dr. Alvino Chapel, who agreed with the proposed treatment course.  After consideration of the diagnostic results and the patient's response to treatment, I feel that the patent would benefit from urgent outpatient follow-up with OB/GYN once the bleeding is controlled and the patient appears stable enough for discharge.  Care transferred to oncoming provider who will follow up on remaining work-up and will determine  disposition.         Final Clinical Impression(s) / ED Diagnoses Final diagnoses:  Abnormal uterine bleeding (AUB)    Rx / DC Orders ED Discharge Orders          Ordered    megestrol (MEGACE) 40 MG tablet  2 times daily        03/14/21 1613              Prince Rome, PA-C 83/15/17 1700    Davonna Belling, MD 03/14/21 2103

## 2021-03-17 DIAGNOSIS — N946 Dysmenorrhea, unspecified: Secondary | ICD-10-CM | POA: Diagnosis not present

## 2021-03-17 DIAGNOSIS — N920 Excessive and frequent menstruation with regular cycle: Secondary | ICD-10-CM | POA: Diagnosis not present

## 2021-03-17 DIAGNOSIS — R9389 Abnormal findings on diagnostic imaging of other specified body structures: Secondary | ICD-10-CM | POA: Diagnosis not present

## 2021-04-14 DIAGNOSIS — Z130 Encounter for screening for diseases of the blood and blood-forming organs and certain disorders involving the immune mechanism: Secondary | ICD-10-CM | POA: Diagnosis not present

## 2021-04-14 DIAGNOSIS — Z1389 Encounter for screening for other disorder: Secondary | ICD-10-CM | POA: Diagnosis not present

## 2021-04-14 DIAGNOSIS — Z01818 Encounter for other preprocedural examination: Secondary | ICD-10-CM | POA: Diagnosis not present

## 2021-04-14 DIAGNOSIS — Z01419 Encounter for gynecological examination (general) (routine) without abnormal findings: Secondary | ICD-10-CM | POA: Diagnosis not present

## 2021-04-23 ENCOUNTER — Encounter (HOSPITAL_COMMUNITY): Payer: Self-pay | Admitting: *Deleted

## 2021-04-23 ENCOUNTER — Other Ambulatory Visit: Payer: Self-pay

## 2021-04-23 ENCOUNTER — Encounter (HOSPITAL_COMMUNITY)
Admission: RE | Admit: 2021-04-23 | Discharge: 2021-04-23 | Disposition: A | Discharge status: Home / Self Care | Payer: BC Managed Care – PPO | Source: Ambulatory Visit | Attending: Obstetrics and Gynecology | Admitting: Obstetrics and Gynecology

## 2021-04-23 DIAGNOSIS — N924 Excessive bleeding in the premenopausal period: Secondary | ICD-10-CM | POA: Diagnosis not present

## 2021-04-23 DIAGNOSIS — Z01812 Encounter for preprocedural laboratory examination: Secondary | ICD-10-CM | POA: Diagnosis not present

## 2021-04-23 LAB — CBC
HCT: 30.1 % — ABNORMAL LOW (ref 36.0–46.0)
Hemoglobin: 9.2 g/dL — ABNORMAL LOW (ref 12.0–15.0)
MCH: 24.9 pg — ABNORMAL LOW (ref 26.0–34.0)
MCHC: 30.6 g/dL (ref 30.0–36.0)
MCV: 81.4 fL (ref 80.0–100.0)
Platelets: 349 10*3/uL (ref 150–400)
RBC: 3.7 MIL/uL — ABNORMAL LOW (ref 3.87–5.11)
RDW: 19 % — ABNORMAL HIGH (ref 11.5–15.5)
WBC: 5.8 10*3/uL (ref 4.0–10.5)
nRBC: 0 % (ref 0.0–0.2)

## 2021-04-23 LAB — COMPREHENSIVE METABOLIC PANEL
ALT: 17 U/L (ref 0–44)
AST: 20 U/L (ref 15–41)
Albumin: 4.2 g/dL (ref 3.5–5.0)
Alkaline Phosphatase: 74 U/L (ref 38–126)
Anion gap: 6 (ref 5–15)
BUN: 10 mg/dL (ref 6–20)
CO2: 23 mmol/L (ref 22–32)
Calcium: 8.9 mg/dL (ref 8.9–10.3)
Chloride: 110 mmol/L (ref 98–111)
Creatinine, Ser: 0.68 mg/dL (ref 0.44–1.00)
GFR, Estimated: >60 mL/min (ref >60)
Glucose, Bld: 105 mg/dL — ABNORMAL HIGH (ref 70–99)
Potassium: 3.8 mmol/L (ref 3.5–5.1)
Sodium: 139 mmol/L (ref 135–145)
Total Bilirubin: 0.8 mg/dL (ref 0.3–1.2)
Total Protein: 7.3 g/dL (ref 6.5–8.1)

## 2021-04-23 NOTE — Progress Notes (Signed)
Spoke w/ via phone for pre-op interview--- pt ?Lab needs dos---- urine preg              ?Lab results------ pt had lab work done today 04-23-2021 CBC/ CMP results in epic ?COVID test -----patient states asymptomatic no test needed ?Arrive at ------- 0530 on 04-27-2021 ?NPO after MN NO Solid Food.  Clear liquids from MN until--- 0430 ?Med rec completed ?Medications to take morning of surgery ----- zyrtec ?Diabetic medication ----- n/a ?Patient instructed no nail polish to be worn day of surgery ?Patient instructed to bring photo id and insurance card day of surgery ?Patient aware to have Driver (ride ) / caregiver for 24 hours after surgery --husband, clinton ?Patient Special Instructions ----- n/a ?Pre-Op special Istructions ----- n/a ?Patient verbalized understanding of instructions that were given at this phone interview. ?Patient denies shortness of breath, chest pain, fever, cough at this phone interview.  ? ?Routed abnormal CBC done today in epic to Dr Melba Coon. ?

## 2021-04-26 NOTE — H&P (Signed)
Melissa Downs is an 56 y.o. female [phone] with no VB x 9 mo then bleeding in January, February and very heavy in March.  [place] in ED revealed thickened endometrium.  Bleeding controlled with Megace.  D/W pt r/b/a of evaluation with hysteroscopy and D&C, also d/w pt process and expectations. ? ?Pertinent Gynecological History: ?[id number] ?SVD x 2, no comp ?Last pap 4/22 ASCUS HR HPV neg ?STD HSV 2 ?EMB 4/22 - benign ? ?Menstrual History: ? ?No LMP recorded. (Menstrual status: Irregular Periods). ?  ? ?Past Medical History:  ?Diagnosis Date  ? Abnormal uterine bleeding (AUB)   ? Iron deficiency anemia due to chronic blood loss   ? previous seen by dr Alvy Bimler (hematoloy/ oncology) lov note in epic 10-26-2019 and last iron infusion 11-02-2019  ? Leiomyoma of uterus   ? Ovarian cyst   ? left  ? Scoliosis   ? Wears glasses   ? ? ?Past Surgical History:  ?Procedure Laterality Date  ? COLONOSCOPY  2018  ? NO PAST SURGERIES    ? ? ?FH: HTN, liver transplantation ? ?Social History:  reports that she has never smoked. She has never used smokeless tobacco. She reports that she does not currently use alcohol. She reports that she does not use drugs. married ? ?Allergies:  ?Allergies  ?Allergen Reactions  ? Coconut Oil Itching  ? ? ?Meds Iron, megesterol, montelukast,naproxen and Zyrtec ? ?Review of Systems  ?Constitutional: Negative.   ?HENT: Negative.    ?Respiratory: Negative.    ?Cardiovascular: Negative.   ?Gastrointestinal: Negative.   ?Genitourinary:  Positive for menstrual problem and pelvic pain.  ?Musculoskeletal: Negative.   ?Skin: Negative.   ?Neurological: Negative.   ?Psychiatric/Behavioral: Negative.    ? ?There were no vitals taken for this visit. ?Physical Exam ?Constitutional:   ?   Appearance: Normal appearance.  ?HENT:  ?   Head: Normocephalic and atraumatic.  ?Cardiovascular:  ?   Rate and Rhythm: Normal rate and regular rhythm.  ?Pulmonary:  ?   Effort: Pulmonary effort is normal.  ?   Breath sounds:  Normal breath sounds.  ?Abdominal:  ?   General: Bowel sounds are normal.  ?   Palpations: Abdomen is soft.  ?Genitourinary: ?   General: Normal vulva.  ?Musculoskeletal:     ?   General: Normal range of motion.  ?   Cervical back: Normal range of motion and neck supple.  ?Skin: ?   General: Skin is warm and dry.  ?Neurological:  ?   General: No focal deficit present.  ?   Mental Status: She is oriented to person, place, and time.  ?Psychiatric:     ?   Mood and Affect: Mood normal.     ?   Behavior: Behavior normal.  ? ?[place]: 2.7cm R fundal fibroid, thickened EMS - 2+cm, nl ovaries ? ?Assessment/Plan: ?58YT [id number] with irregular VB and thickened EMS for hysteroscopy/D&C ?D/w pt r/b/a  ?Will proceed ? ?Melissa Downs ?04/26/2021, 11:30 AM ? ?

## 2021-04-27 ENCOUNTER — Ambulatory Visit (HOSPITAL_BASED_OUTPATIENT_CLINIC_OR_DEPARTMENT_OTHER): Payer: BC Managed Care – PPO | Admitting: Certified Registered Nurse Anesthetist

## 2021-04-27 ENCOUNTER — Other Ambulatory Visit: Payer: Self-pay

## 2021-04-27 ENCOUNTER — Ambulatory Visit (HOSPITAL_BASED_OUTPATIENT_CLINIC_OR_DEPARTMENT_OTHER)
Admission: RE | Admit: 2021-04-27 | Discharge: 2021-04-27 | Disposition: A | Discharge status: Home / Self Care | Payer: BC Managed Care – PPO | Attending: Obstetrics and Gynecology | Admitting: Obstetrics and Gynecology

## 2021-04-27 ENCOUNTER — Encounter (HOSPITAL_BASED_OUTPATIENT_CLINIC_OR_DEPARTMENT_OTHER): Payer: Self-pay | Admitting: Obstetrics and Gynecology

## 2021-04-27 ENCOUNTER — Encounter (HOSPITAL_BASED_OUTPATIENT_CLINIC_OR_DEPARTMENT_OTHER)
Admission: RE | Disposition: A | Discharge status: Home / Self Care | Payer: Self-pay | Source: Home / Self Care | Attending: Obstetrics and Gynecology

## 2021-04-27 DIAGNOSIS — N920 Excessive and frequent menstruation with regular cycle: Secondary | ICD-10-CM | POA: Insufficient documentation

## 2021-04-27 DIAGNOSIS — R9389 Abnormal findings on diagnostic imaging of other specified body structures: Secondary | ICD-10-CM | POA: Diagnosis not present

## 2021-04-27 DIAGNOSIS — N924 Excessive bleeding in the premenopausal period: Secondary | ICD-10-CM | POA: Diagnosis present

## 2021-04-27 DIAGNOSIS — N950 Postmenopausal bleeding: Secondary | ICD-10-CM

## 2021-04-27 DIAGNOSIS — D649 Anemia, unspecified: Secondary | ICD-10-CM | POA: Diagnosis not present

## 2021-04-27 HISTORY — DX: Iron deficiency anemia secondary to blood loss (chronic): D50.0

## 2021-04-27 HISTORY — DX: Abnormal findings on diagnostic imaging of other specified body structures: R93.89

## 2021-04-27 HISTORY — DX: Leiomyoma of uterus, unspecified: D25.9

## 2021-04-27 HISTORY — DX: Abnormal uterine and vaginal bleeding, unspecified: N93.9

## 2021-04-27 HISTORY — DX: Postmenopausal bleeding: N95.0

## 2021-04-27 HISTORY — DX: Presence of spectacles and contact lenses: Z97.3

## 2021-04-27 HISTORY — DX: Scoliosis, unspecified: M41.9

## 2021-04-27 HISTORY — PX: HYSTEROSCOPY WITH D & C: SHX1775

## 2021-04-27 LAB — POCT PREGNANCY, URINE: Preg Test, Ur: NEGATIVE

## 2021-04-27 SURGERY — DILATATION AND CURETTAGE /HYSTEROSCOPY
Anesthesia: General | Site: Uterus

## 2021-04-27 MED ORDER — OXYCODONE HCL 5 MG PO TABS: 5.0000 mg | ORAL_TABLET | Freq: Four times a day (QID) | ORAL | 0 refills | Status: DC | PRN

## 2021-04-27 MED ORDER — ONDANSETRON HCL 4 MG/2ML IJ SOLN: 4.0000 mg | Freq: Once | INTRAMUSCULAR | Status: DC | PRN

## 2021-04-27 MED ORDER — DEXAMETHASONE SODIUM PHOSPHATE 10 MG/ML IJ SOLN
INTRAMUSCULAR | Status: AC
  Filled 2021-04-27: qty 1

## 2021-04-27 MED ORDER — MIDAZOLAM HCL 5 MG/5ML IJ SOLN
INTRAMUSCULAR | Status: DC | PRN
Start: 2021-04-27 — End: 2021-04-27
  Administered 2021-04-27: 2 mg via INTRAVENOUS

## 2021-04-27 MED ORDER — OXYCODONE HCL 5 MG PO TABS: 5.0000 mg | ORAL_TABLET | Freq: Once | ORAL | Status: DC | PRN

## 2021-04-27 MED ORDER — FENTANYL CITRATE (PF) 100 MCG/2ML IJ SOLN
INTRAMUSCULAR | Status: DC | PRN
  Administered 2021-04-27 (×2): 50 ug via INTRAVENOUS

## 2021-04-27 MED ORDER — KETOROLAC TROMETHAMINE 30 MG/ML IJ SOLN: 30.0000 mg | Freq: Once | INTRAMUSCULAR | Status: DC | PRN

## 2021-04-27 MED ORDER — LIDOCAINE HCL 1 % IJ SOLN
INTRAMUSCULAR | Status: DC | PRN
  Administered 2021-04-27: 10 mL

## 2021-04-27 MED ORDER — LACTATED RINGERS IV SOLN: INTRAVENOUS | Status: DC

## 2021-04-27 MED ORDER — FENTANYL CITRATE (PF) 100 MCG/2ML IJ SOLN: 25.0000 ug | INTRAMUSCULAR | Status: DC | PRN

## 2021-04-27 MED ORDER — MIDAZOLAM HCL 2 MG/2ML IJ SOLN
INTRAMUSCULAR | Status: AC
  Filled 2021-04-27: qty 2

## 2021-04-27 MED ORDER — KETOROLAC TROMETHAMINE 30 MG/ML IJ SOLN
INTRAMUSCULAR | Status: DC | PRN
  Administered 2021-04-27: 30 mg via INTRAVENOUS

## 2021-04-27 MED ORDER — ARTIFICIAL TEARS OPHTHALMIC OINT
TOPICAL_OINTMENT | OPHTHALMIC | Status: AC
  Filled 2021-04-27: qty 3.5

## 2021-04-27 MED ORDER — EPHEDRINE SULFATE-NACL 50-0.9 MG/10ML-% IV SOSY
PREFILLED_SYRINGE | INTRAVENOUS | Status: DC | PRN
  Administered 2021-04-27: 5 mg via INTRAVENOUS

## 2021-04-27 MED ORDER — ACETAMINOPHEN 500 MG PO TABS
1000.0000 mg | ORAL_TABLET | ORAL | Status: AC
  Administered 2021-04-27: 1000 mg via ORAL

## 2021-04-27 MED ORDER — NAPROXEN 500 MG PO TABS: 500.0000 mg | ORAL_TABLET | Freq: Two times a day (BID) | ORAL | 1 refills | Status: DC | PRN

## 2021-04-27 MED ORDER — SODIUM CHLORIDE 0.9 % IR SOLN
Status: DC | PRN
  Administered 2021-04-27: 3000 mL

## 2021-04-27 MED ORDER — DROPERIDOL 2.5 MG/ML IJ SOLN
INTRAMUSCULAR | Status: AC
  Filled 2021-04-27: qty 2

## 2021-04-27 MED ORDER — PROPOFOL 10 MG/ML IV BOLUS
INTRAVENOUS | Status: AC
  Filled 2021-04-27: qty 20

## 2021-04-27 MED ORDER — LIDOCAINE 2% (20 MG/ML) 5 ML SYRINGE
INTRAMUSCULAR | Status: DC | PRN
Start: 2021-04-27 — End: 2021-04-27
  Administered 2021-04-27: 100 mg via INTRAVENOUS

## 2021-04-27 MED ORDER — ONDANSETRON HCL 4 MG/2ML IJ SOLN
INTRAMUSCULAR | Status: DC | PRN
  Administered 2021-04-27: 4 mg via INTRAVENOUS

## 2021-04-27 MED ORDER — OXYCODONE HCL 5 MG/5ML PO SOLN: 5.0000 mg | Freq: Once | ORAL | Status: DC | PRN

## 2021-04-27 MED ORDER — LIDOCAINE HCL (PF) 2 % IJ SOLN
INTRAMUSCULAR | Status: AC
  Filled 2021-04-27: qty 5

## 2021-04-27 MED ORDER — POVIDONE-IODINE 10 % EX SWAB: 2.0000 "application " | Freq: Once | CUTANEOUS | Status: DC

## 2021-04-27 MED ORDER — ONDANSETRON HCL 4 MG/2ML IJ SOLN
INTRAMUSCULAR | Status: AC
  Filled 2021-04-27: qty 2

## 2021-04-27 MED ORDER — ACETAMINOPHEN 500 MG PO TABS
ORAL_TABLET | ORAL | Status: AC
  Filled 2021-04-27: qty 2

## 2021-04-27 MED ORDER — PROPOFOL 10 MG/ML IV BOLUS
INTRAVENOUS | Status: DC | PRN
  Administered 2021-04-27: 200 mg via INTRAVENOUS

## 2021-04-27 MED ORDER — FENTANYL CITRATE (PF) 100 MCG/2ML IJ SOLN
INTRAMUSCULAR | Status: AC
  Filled 2021-04-27: qty 2

## 2021-04-27 MED ORDER — DEXAMETHASONE SODIUM PHOSPHATE 10 MG/ML IJ SOLN
INTRAMUSCULAR | Status: DC | PRN
  Administered 2021-04-27: 10 mg via INTRAVENOUS

## 2021-04-27 MED ORDER — PHENYLEPHRINE 80 MCG/ML (10ML) SYRINGE FOR IV PUSH (FOR BLOOD PRESSURE SUPPORT)
PREFILLED_SYRINGE | INTRAVENOUS | Status: DC | PRN
  Administered 2021-04-27: 160 ug via INTRAVENOUS
  Administered 2021-04-27: 80 ug via INTRAVENOUS
  Administered 2021-04-27 (×2): 160 ug via INTRAVENOUS

## 2021-04-27 SURGICAL SUPPLY — 20 items
BIPOLAR CUTTING LOOP 21FR (ELECTRODE)
CATH ROBINSON RED A/P 16FR (CATHETERS) ×2 IMPLANT
DILATOR CANAL MILEX (MISCELLANEOUS) IMPLANT
DRSG TELFA 3X8 NADH (GAUZE/BANDAGES/DRESSINGS) ×2 IMPLANT
GAUZE 4X4 16PLY ~~LOC~~+RFID DBL (SPONGE) ×6 IMPLANT
GLOVE BIO SURGEON STRL SZ 6.5 (GLOVE) ×3 IMPLANT
GOWN STRL REUS W/TWL LRG LVL3 (GOWN DISPOSABLE) ×3 IMPLANT
KIT PROCEDURE FLUENT (KITS) ×3 IMPLANT
KIT TURNOVER CYSTO (KITS) ×3 IMPLANT
LOOP CUTTING BIPOLAR 21FR (ELECTRODE) IMPLANT
PACK VAGINAL MINOR WOMEN LF (CUSTOM PROCEDURE TRAY) ×3 IMPLANT
PAD DRESSING TELFA 3X8 NADH (GAUZE/BANDAGES/DRESSINGS) ×2 IMPLANT
PAD OB MATERNITY 4.3X12.25 (PERSONAL CARE ITEMS) ×3 IMPLANT
PAD PREP 24X48 CUFFED NSTRL (MISCELLANEOUS) ×3 IMPLANT
SEAL ROD LENS SCOPE MYOSURE (ABLATOR) ×3 IMPLANT
SET BERKELEY SUCTION TUBING (SUCTIONS) ×1 IMPLANT
SYR 20ML LL LF (SYRINGE) IMPLANT
TOWEL OR 17X26 10 PK STRL BLUE (TOWEL DISPOSABLE) ×5 IMPLANT
VACURETTE 8 RIGID CVD (CANNULA) ×1 IMPLANT
WATER STERILE IRR 500ML POUR (IV SOLUTION) ×3 IMPLANT

## 2021-04-27 NOTE — Anesthesia Preprocedure Evaluation (Signed)
Anesthesia Evaluation  ?Patient identified by MRN, date of birth, ID band ?Patient awake ? ? ? ?Reviewed: ?Allergy & Precautions, NPO status , Patient's Chart, lab work & pertinent test results ? ?Airway ?Mallampati: II ? ?TM Distance: >3 FB ?Neck ROM: Full ? ? ? Dental ?no notable dental hx. ? ?  ?Pulmonary ?neg pulmonary ROS,  ?  ?Pulmonary exam normal ?breath sounds clear to auscultation ? ? ? ? ? ? Cardiovascular ?negative cardio ROS ?Normal cardiovascular exam ?Rhythm:Regular Rate:Normal ? ? ?  ?Neuro/Psych ?negative neurological ROS ? negative psych ROS  ? GI/Hepatic ?negative GI ROS, Neg liver ROS,   ?Endo/Other  ?negative endocrine ROS ? Renal/GU ?negative Renal ROS  ?negative genitourinary ?  ?Musculoskeletal ?negative musculoskeletal ROS ?(+)  ? Abdominal ?  ?Peds ?negative pediatric ROS ?(+)  Hematology ? ?(+) Blood dyscrasia, anemia ,   ?Anesthesia Other Findings ? ? Reproductive/Obstetrics ?negative OB ROS ? ?  ? ? ? ? ? ? ? ? ? ? ? ? ? ?  ?  ? ? ? ? ? ? ? ? ?Anesthesia Physical ?Anesthesia Plan ? ?ASA: 2 ? ?Anesthesia Plan: General  ? ?Post-op Pain Management: Minimal or no pain anticipated  ? ?Induction: Intravenous ? ?PONV Risk Score and Plan: 3 and Ondansetron, Dexamethasone, Midazolam and Treatment may vary due to age or medical condition ? ?Airway Management Planned: LMA ? ?Additional Equipment:  ? ?Intra-op Plan:  ? ?Post-operative Plan: Extubation in OR ? ?Informed Consent: I have reviewed the patients History and Physical, chart, labs and discussed the procedure including the risks, benefits and alternatives for the proposed anesthesia with the patient or authorized representative who has indicated his/her understanding and acceptance.  ? ? ? ?Dental advisory given ? ?Plan Discussed with: CRNA and Surgeon ? ?Anesthesia Plan Comments:   ? ? ? ? ? ? ?Anesthesia Quick Evaluation ? ?

## 2021-04-27 NOTE — Op Note (Signed)
NAME: KAYDON, CREEDON A. ?MEDICAL RECORD NO: [id number] ?ACCOUNT NO: [id number] ?DATE OF BIRTH: 25-Sep-1965 ?FACILITY: [facility] ?LOCATION: [facility] ?PHYSICIAN: Janyth Contes, MD ? ?Operative Report  ? ?DATE OF PROCEDURE: 04/27/2021 ? ?PREOPERATIVE DIAGNOSIS:  Menorrhagia. ? ?POSTOPERATIVE DIAGNOSIS:  Menorrhagia. ? ?PROCEDURE:  Hysteroscopy, D and C and suction D and E. ? ?SURGEON:  Janyth Contes, MD ? ?COMPLICATIONS:  The uterus was enlarged to sounding a length of 16.  There was some difficulty visualizing the entire uterine cavity. ? ?PATHOLOGY:  Endometrial curettings. ? ?ESTIMATED BLOOD LOSS:  250 mL. ? ?INTRAVENOUS FLUIDS:  Per anesthesia.  She voided directly before the OR and a fluid deficit of 525 mL. ? ?DESCRIPTION OF PROCEDURE:  After informed consent was reviewed with the patient including risks, benefits and alternatives of the surgical procedure, she was transported to the operating room and placed on the table in supine position.  After general  ?anesthesia was induced and found to be adequate, she was then placed in the [place] stirrups, prepped and draped in the normal sterile fashion.  After an appropriate timeout was performed, an open-sided speculum was used to visualize her cervix, dilated ? to accommodate the hysteroscope.  Her uterus was initially sounded to 16 cm.  This was confirmed on the second sounding length.  The hysteroscope was introduced into her uterus and copious amounts of fluffy tissue was noted, difficulty with  ?visualization given the size of the cavity, the ostia were not confidently identified.  A D and C was performed.  The length of the sharp curette made it difficult to perform the D and C.  Decision was made to proceed with a suction D and E.  Several  ?passes with the suction were obtained and the sac was completely full, using the suction machine.  Therefore, it was changed out and another sac was mostly full. The deficit after the hysteroscope was somewhat  large, but then decreased to 200 mL.  This made the likelihood of a perfoartion much decreased. Several other attempts to visualize the entire cavity with the hysteroscope between passes with the suction D and currettage revealed somewhat more normal-appearing endometrial/myometrium.  The patient  ?tolerated the procedure well.  Sponge, lap and needle counts were correct x2, at the end of the procedure, she is awakened in stable condition and transported to the PACU. ? ? ?SHW ?D: 04/27/2021 8:35:07 am T: 04/27/2021 8:59:00 am  ?JOB: [id number]/ [id number]  ?

## 2021-04-27 NOTE — Brief Op Note (Signed)
04/27/2021 ? ?8:25 AM ? ?PATIENT:  Melissa Downs  56 y.o. female ? ?PRE-OPERATIVE DIAGNOSIS:  menorrhagia ? ?POST-OPERATIVE DIAGNOSIS:  menorrhagia ? ?PROCEDURE:  Procedure(s): ?DILATATION AND CURETTAGE /HYSTEROSCOPY/SUCTION D&E (N/A) ? ?SURGEON:  Surgeon(s) and Role: ?   * Bovard-Stuckert, Jeral Fruit, MD - Primary ? ?ANESTHESIA:   general by LMA ? ?EBL:  250 mL IVF by anesthesia, voided directly before OR, fluid deficit 525cc ? ?DRAINS: none  ? ?LOCAL MEDICATIONS USED:  LIDOCAINE  ? ?SPECIMEN:  Source of Specimen:  endometrial curetting ? ?DISPOSITION OF SPECIMEN:  PATHOLOGY ? ?COUNTS:  YES ? ?TOURNIQUET:  * No tourniquets in log * ? ?DICTATION: .Other Dictation: Dictation Number [id number] ? ?PLAN OF CARE: Discharge to home after PACU ? ?PATIENT DISPOSITION:  PACU - hemodynamically stable. ?  ?Delay start of Pharmacological VTE agent (>24hrs) due to surgical blood loss or risk of bleeding: not applicable ? ?

## 2021-04-27 NOTE — Anesthesia Procedure Notes (Signed)
Procedure Name: LMA Insertion ?Date/Time: 04/27/2021 7:31 AM ?Performed by: Rogers Blocker, CRNA ?Pre-anesthesia Checklist: Patient identified, Emergency Drugs available, Suction available and Patient being monitored ?Patient Re-evaluated:Patient Re-evaluated prior to induction ?Oxygen Delivery Method: Circle System Utilized ?Preoxygenation: Pre-oxygenation with 100% oxygen ?Induction Type: IV induction ?Ventilation: Mask ventilation without difficulty ?LMA: LMA inserted ?LMA Size: 4.0 ?Number of attempts: 1 ?Placement Confirmation: positive ETCO2 ?Tube secured with: Tape ?Dental Injury: Teeth and Oropharynx as per pre-operative assessment  ? ? ? ? ?

## 2021-04-27 NOTE — Transfer of Care (Signed)
Immediate Anesthesia Transfer of Care Note ? ?Patient: Melissa Downs ? ?Procedure(s) Performed: DILATATION AND CURETTAGE /HYSTEROSCOPY/SUCTION D&E (Uterus) ? ?Patient Location: PACU ? ?Anesthesia Type:General ? ?Level of Consciousness: drowsy and responds to stimulation ? ?Airway & Oxygen Therapy: Patient Spontanous Breathing and Patient connected to face mask oxygen ? ?Post-op Assessment: Report given to RN and Post -op Vital signs reviewed and stable ? ?Post vital signs: Reviewed and stable ? ?Last Vitals:  ?Vitals Value Taken Time  ?BP 111/61 04/27/21 0819  ?Temp 36.4 ?C 04/27/21 0825  ?Pulse 92 04/27/21 0825  ?Resp 20 04/27/21 0825  ?SpO2 94 % 04/27/21 0825  ?Vitals shown include unvalidated device data. ? ?Last Pain:  ?Vitals:  ? 04/27/21 0558  ?TempSrc: Oral  ?PainSc: 2   ?   ? ?Patients Stated Pain Goal: 5 (04/27/21 0558) ? ?Complications: No notable events documented. ?

## 2021-04-27 NOTE — Anesthesia Postprocedure Evaluation (Signed)
Anesthesia Post Note ? ?Patient: Melissa Downs ? ?Procedure(s) Performed: DILATATION AND CURETTAGE /HYSTEROSCOPY/SUCTION D&E (Uterus) ? ?  ? ?Patient location during evaluation: PACU ?Anesthesia Type: General ?Level of consciousness: awake and alert ?Pain management: pain level controlled ?Vital Signs Assessment: post-procedure vital signs reviewed and stable ?Respiratory status: spontaneous breathing, nonlabored ventilation, respiratory function stable and patient connected to nasal cannula oxygen ?Cardiovascular status: stable and blood pressure returned to baseline ?Postop Assessment: no apparent nausea or vomiting ?Anesthetic complications: no ? ? ?No notable events documented. ? ?Last Vitals:  ?Vitals:  ? 04/27/21 0825 04/27/21 0835  ?BP:    ?Pulse: 95 (!) 104  ?Resp: (!) 24 18  ?Temp: 36.4 ?C   ?SpO2: 96% 99%  ?  ?Last Pain:  ?Vitals:  ? 04/27/21 0835  ?TempSrc:   ?PainSc: 0-No pain  ? ? ?  ?  ?  ?  ?  ?  ? ?Ambrielle Kington S ? ? ? ? ?

## 2021-04-27 NOTE — Interval H&P Note (Signed)
History and Physical Interval Note: ? ?04/27/2021 ?7:09 AM ? ?Melissa Downs  has presented today for surgery, with the diagnosis of menorrhagia.  The various methods of treatment have been discussed with the patient and family. After consideration of risks, benefits and other options for treatment, the patient has consented to  Procedure(s): ?DILATATION AND CURETTAGE /HYSTEROSCOPY (N/A) as a surgical intervention.  The patient's history has been reviewed, patient examined, no change in status, stable for surgery.  I have reviewed the patient's chart and labs.  Questions were answered to the patient's satisfaction.   ? ? ?[name] ? ? ?

## 2021-04-27 NOTE — Discharge Instructions (Signed)

## 2021-04-28 ENCOUNTER — Encounter (HOSPITAL_BASED_OUTPATIENT_CLINIC_OR_DEPARTMENT_OTHER): Payer: Self-pay | Admitting: Obstetrics and Gynecology

## 2021-04-28 LAB — SURGICAL PATHOLOGY

## 2021-07-08 DIAGNOSIS — H3530 Unspecified macular degeneration: Secondary | ICD-10-CM | POA: Diagnosis not present

## 2021-07-08 DIAGNOSIS — H40013 Open angle with borderline findings, low risk, bilateral: Secondary | ICD-10-CM | POA: Diagnosis not present

## 2021-08-06 DIAGNOSIS — N946 Dysmenorrhea, unspecified: Secondary | ICD-10-CM | POA: Diagnosis not present

## 2021-08-06 DIAGNOSIS — Z780 Asymptomatic menopausal state: Secondary | ICD-10-CM | POA: Diagnosis not present

## 2021-08-06 DIAGNOSIS — N920 Excessive and frequent menstruation with regular cycle: Secondary | ICD-10-CM | POA: Diagnosis not present

## 2021-12-21 ENCOUNTER — Other Ambulatory Visit: Payer: Self-pay | Admitting: Obstetrics and Gynecology

## 2021-12-21 DIAGNOSIS — Z1231 Encounter for screening mammogram for malignant neoplasm of breast: Secondary | ICD-10-CM

## 2021-12-23 ENCOUNTER — Ambulatory Visit
Admission: RE | Admit: 2021-12-23 | Discharge: 2021-12-23 | Disposition: A | Discharge status: Home / Self Care | Payer: BC Managed Care – PPO | Source: Ambulatory Visit | Attending: Obstetrics and Gynecology | Admitting: Obstetrics and Gynecology

## 2021-12-23 DIAGNOSIS — Z1231 Encounter for screening mammogram for malignant neoplasm of breast: Secondary | ICD-10-CM

## 2022-01-11 DIAGNOSIS — Z1159 Encounter for screening for other viral diseases: Secondary | ICD-10-CM | POA: Diagnosis not present

## 2022-01-11 DIAGNOSIS — Z13220 Encounter for screening for lipoid disorders: Secondary | ICD-10-CM | POA: Diagnosis not present

## 2022-01-11 DIAGNOSIS — Z0000 Encounter for general adult medical examination without abnormal findings: Secondary | ICD-10-CM | POA: Diagnosis not present

## 2022-01-11 DIAGNOSIS — D509 Iron deficiency anemia, unspecified: Secondary | ICD-10-CM | POA: Diagnosis not present

## 2022-02-12 DIAGNOSIS — N951 Menopausal and female climacteric states: Secondary | ICD-10-CM | POA: Diagnosis not present

## 2022-02-12 DIAGNOSIS — R61 Generalized hyperhidrosis: Secondary | ICD-10-CM | POA: Diagnosis not present

## 2022-04-06 DIAGNOSIS — N920 Excessive and frequent menstruation with regular cycle: Secondary | ICD-10-CM | POA: Diagnosis not present

## 2022-04-06 DIAGNOSIS — N939 Abnormal uterine and vaginal bleeding, unspecified: Secondary | ICD-10-CM | POA: Diagnosis not present

## 2022-04-10 DIAGNOSIS — N920 Excessive and frequent menstruation with regular cycle: Secondary | ICD-10-CM | POA: Diagnosis not present

## 2022-04-10 DIAGNOSIS — R58 Hemorrhage, not elsewhere classified: Secondary | ICD-10-CM | POA: Diagnosis not present

## 2022-04-10 DIAGNOSIS — R1084 Generalized abdominal pain: Secondary | ICD-10-CM | POA: Diagnosis not present

## 2022-04-10 DIAGNOSIS — M549 Dorsalgia, unspecified: Secondary | ICD-10-CM | POA: Diagnosis not present

## 2022-04-10 DIAGNOSIS — D649 Anemia, unspecified: Secondary | ICD-10-CM | POA: Diagnosis not present

## 2022-04-10 DIAGNOSIS — E860 Dehydration: Secondary | ICD-10-CM | POA: Diagnosis not present

## 2022-04-11 ENCOUNTER — Emergency Department (HOSPITAL_COMMUNITY)
Admission: EM | Admit: 2022-04-11 | Discharge: 2022-04-11 | Disposition: A | Discharge status: Home / Self Care | Payer: BC Managed Care – PPO | Attending: Emergency Medicine | Admitting: Emergency Medicine

## 2022-04-11 ENCOUNTER — Encounter (HOSPITAL_COMMUNITY): Payer: Self-pay

## 2022-04-11 ENCOUNTER — Other Ambulatory Visit: Payer: Self-pay

## 2022-04-11 DIAGNOSIS — R109 Unspecified abdominal pain: Secondary | ICD-10-CM | POA: Insufficient documentation

## 2022-04-11 DIAGNOSIS — N920 Excessive and frequent menstruation with regular cycle: Secondary | ICD-10-CM | POA: Diagnosis not present

## 2022-04-11 DIAGNOSIS — R42 Dizziness and giddiness: Secondary | ICD-10-CM | POA: Diagnosis not present

## 2022-04-11 DIAGNOSIS — M549 Dorsalgia, unspecified: Secondary | ICD-10-CM | POA: Diagnosis not present

## 2022-04-11 DIAGNOSIS — D649 Anemia, unspecified: Secondary | ICD-10-CM | POA: Diagnosis not present

## 2022-04-11 DIAGNOSIS — N939 Abnormal uterine and vaginal bleeding, unspecified: Secondary | ICD-10-CM | POA: Diagnosis not present

## 2022-04-11 DIAGNOSIS — Z5189 Encounter for other specified aftercare: Secondary | ICD-10-CM

## 2022-04-11 DIAGNOSIS — N924 Excessive bleeding in the premenopausal period: Secondary | ICD-10-CM | POA: Insufficient documentation

## 2022-04-11 HISTORY — DX: Encounter for other specified aftercare: Z51.89

## 2022-04-11 LAB — URINALYSIS, ROUTINE W REFLEX MICROSCOPIC
Bacteria, UA: NONE SEEN
Bilirubin Urine: NEGATIVE
Glucose, UA: NEGATIVE mg/dL
Ketones, ur: NEGATIVE mg/dL
Nitrite: NEGATIVE
Protein, ur: 30 mg/dL — AB
RBC / HPF: >50 RBC/hpf (ref 0–5)
Specific Gravity, Urine: 1.012 (ref 1.005–1.030)
pH: 5 (ref 5.0–8.0)

## 2022-04-11 LAB — CBC WITH DIFFERENTIAL/PLATELET
Abs Immature Granulocytes: 0.09 10*3/uL — ABNORMAL HIGH (ref 0.00–0.07)
Basophils Absolute: 0 10*3/uL (ref 0.0–0.1)
Basophils Relative: 0 %
Eosinophils Absolute: 0 10*3/uL (ref 0.0–0.5)
Eosinophils Relative: 0 %
HCT: 17.9 % — ABNORMAL LOW (ref 36.0–46.0)
Hemoglobin: 5.8 g/dL — CL (ref 12.0–15.0)
Immature Granulocytes: 1 %
Lymphocytes Relative: 11 %
Lymphs Abs: 1.2 10*3/uL (ref 0.7–4.0)
MCH: 28.9 pg (ref 26.0–34.0)
MCHC: 32.4 g/dL (ref 30.0–36.0)
MCV: 89.1 fL (ref 80.0–100.0)
Monocytes Absolute: 1 10*3/uL (ref 0.1–1.0)
Monocytes Relative: 9 %
Neutro Abs: 8.1 10*3/uL — ABNORMAL HIGH (ref 1.7–7.7)
Neutrophils Relative %: 79 %
Platelets: 258 10*3/uL (ref 150–400)
RBC: 2.01 MIL/uL — ABNORMAL LOW (ref 3.87–5.11)
RDW: 14.6 % (ref 11.5–15.5)
WBC: 10.4 10*3/uL (ref 4.0–10.5)
nRBC: 1 % — ABNORMAL HIGH (ref 0.0–0.2)

## 2022-04-11 LAB — BASIC METABOLIC PANEL
Anion gap: 8 (ref 5–15)
BUN: 6 mg/dL (ref 6–20)
CO2: 25 mmol/L (ref 22–32)
Calcium: 7.7 mg/dL — ABNORMAL LOW (ref 8.9–10.3)
Chloride: 102 mmol/L (ref 98–111)
Creatinine, Ser: 0.68 mg/dL (ref 0.44–1.00)
GFR, Estimated: >60 mL/min (ref >60)
Glucose, Bld: 138 mg/dL — ABNORMAL HIGH (ref 70–99)
Potassium: 3.2 mmol/L — ABNORMAL LOW (ref 3.5–5.1)
Sodium: 135 mmol/L (ref 135–145)

## 2022-04-11 LAB — BPAM RBC
Blood Product Expiration Date: 202404262359
Blood Product Expiration Date: 202404262359
ISSUE DATE / TIME: 202404070744
Unit Type and Rh: 6200

## 2022-04-11 LAB — ABO/RH: ABO/RH(D): A POS

## 2022-04-11 LAB — PREGNANCY, URINE: Preg Test, Ur: NEGATIVE

## 2022-04-11 LAB — SAMPLE TO BLOOD BANK

## 2022-04-11 LAB — PREPARE RBC (CROSSMATCH)

## 2022-04-11 MED ORDER — SODIUM CHLORIDE 0.9% IV SOLUTION: Freq: Once | INTRAVENOUS | Status: AC

## 2022-04-11 MED ORDER — SODIUM CHLORIDE 0.9 % IV BOLUS
1000.0000 mL | Freq: Once | INTRAVENOUS | Status: AC
  Administered 2022-04-11: 1000 mL via INTRAVENOUS

## 2022-04-11 MED ORDER — MEDROXYPROGESTERONE ACETATE 10 MG PO TABS
10.0000 mg | ORAL_TABLET | Freq: Every day | ORAL | Status: DC
  Administered 2022-04-11: 10 mg via ORAL
  Filled 2022-04-11: qty 1

## 2022-04-11 MED ORDER — MEDROXYPROGESTERONE ACETATE 5 MG PO TABS: 10.0000 mg | ORAL_TABLET | Freq: Every day | ORAL | 1 refills | Status: DC

## 2022-04-11 MED ORDER — KETOROLAC TROMETHAMINE 15 MG/ML IJ SOLN
15.0000 mg | Freq: Once | INTRAMUSCULAR | Status: AC
  Administered 2022-04-11: 15 mg via INTRAVENOUS
  Filled 2022-04-11: qty 1

## 2022-04-11 NOTE — ED Provider Notes (Addendum)
[place] EMERGENCY DEPARTMENT AT [facility] Provider Note   CSN: [id number] Arrival date & time: 04/11/22  0000     History  Chief Complaint  Patient presents with   Back Pain    Melissa Downs is a 57 y.o. female.  57 year old female with a history of iron deficiency anemia, uterine fibroids presents to the emergency department for vaginal bleeding and abdominal pain.  She underwent D&C 1 year ago for menorrhagia and had been 11 months without a period.  She thought that she was entering menopause until she began to have vaginal bleeding again on 04/01/22 with lower abdominal cramping and L flank pain. Saw her OBGYN on 04/07/22 who started the patient on TXA and tramadol for symptom management. Became nauseated after starting tramadol with ongoing vomiting from Friday into Saturday. Has felt lightheaded at times with worsening muscle cramping, "like my iron is getting low". She has required the use of ~4 pads/tampons per day for bleeding management. Called EMS due to severe worsening of her pelvic and flank pain/cramping. Denies fever, hematemesis, diarrhea, melena/hematochezia, dysuria, urinary frequency/urgency, lower extremity numbness or paresthesias, extremity weakness, and incontinence. No hx of kidney stones.  Received 150 mg fentanyl and 4 mg Zofran and route to ED for pain and nausea management.  The history is provided by the patient. No language interpreter was used.  Back Pain      Home Medications Prior to Admission medications   Medication Sig Start Date End Date Taking? Authorizing Provider  medroxyPROGESTERone (PROVERA) 5 MG tablet Take 2 tablets (10 mg total) by mouth daily. 04/11/22 05/11/22 Yes Antony Madura, PA-C  Azelastine-Fluticasone 137-50 MCG/ACT SUSP Place 1-2 sprays into both nostrils daily as needed (for allergies).    [provider]  cetirizine (ZYRTEC) 10 MG tablet Take 10 mg by mouth daily.    [provider]  ferrous sulfate  325 (65 FE) MG tablet Take 1 tablet (325 mg total) by mouth 2 (two) times daily with a meal. Patient taking differently: Take 325 mg by mouth daily. Per pt alternates w/ fusion plus 04/06/20   Gwyneth Sprout, MD  Iron-FA-B Cmp-C-Biot-Probiotic (FUSION PLUS) CAPS Take 1 capsule by mouth daily. Alternates w/ ferrous sultate    [provider]  megestrol (MEGACE) 40 MG tablet Take 40 mg by mouth as directed. Takes twice daily w/ heavy menstral bleeding and but do take once daily    [provider]  Multiple Vitamins-Minerals (MULTIVITAMIN ADULTS PO) Take by mouth daily.    [provider]  naproxen (NAPROSYN) 500 MG tablet Take 1 tablet (500 mg total) by mouth 2 (two) times daily as needed. 04/27/21   Bovard-Stuckert, Augusto Gamble, MD  oxyCODONE (OXY IR/ROXICODONE) 5 MG immediate release tablet Take 1 tablet (5 mg total) by mouth every 6 (six) hours as needed for severe pain. 04/27/21   Bovard-Stuckert, Augusto Gamble, MD      Allergies    Coconut (cocos nucifera)    Review of Systems   Review of Systems  Musculoskeletal:  Positive for back pain.  Ten systems reviewed and are negative for acute change, except as noted in the HPI.    Physical Exam Updated Vital Signs BP (!) 138/49   Pulse 90   Temp 98.5 F (36.9 C) (Oral)   Resp 19   Ht 5\' 4"  (1.626 m)   Wt 81.6 kg   SpO2 100%   BMI 30.90 kg/m   Physical Exam Vitals and nursing note reviewed.  Constitutional:  General: She is not in acute distress.    Appearance: She is well-developed. She is not diaphoretic.     Comments: Resting comfortably at this time, in no acute distress.  HENT:     Head: Normocephalic and atraumatic.  Eyes:     General: No scleral icterus.    Conjunctiva/sclera: Conjunctivae normal.  Cardiovascular:     Rate and Rhythm: Regular rhythm. Tachycardia present.     Pulses: Normal pulses.  Pulmonary:     Effort: Pulmonary effort is normal. No respiratory distress.     Comments: Respirations  even and unlabored Abdominal:     Comments: Soft, obese abdomen  Musculoskeletal:        General: Normal range of motion.     Cervical back: Normal range of motion.  Skin:    General: Skin is warm and dry.     Coloration: Skin is not pale.     Findings: No erythema or rash.  Neurological:     Mental Status: She is alert and oriented to person, place, and time.     Coordination: Coordination normal.  Psychiatric:        Behavior: Behavior normal.     ED Results / Procedures / Treatments   Labs (all labs ordered are listed, but only abnormal results are displayed) Labs Reviewed  CBC WITH DIFFERENTIAL/PLATELET - Abnormal; Notable for the following components:      Result Value   RBC 2.01 (*)    Hemoglobin 5.8 (*)    HCT 17.9 (*)    nRBC 1.0 (*)    Neutro Abs 8.1 (*)    Abs Immature Granulocytes 0.09 (*)    All other components within normal limits  BASIC METABOLIC PANEL - Abnormal; Notable for the following components:   Potassium 3.2 (*)    Glucose, Bld 138 (*)    Calcium 7.7 (*)    All other components within normal limits  URINALYSIS, ROUTINE W REFLEX MICROSCOPIC - Abnormal; Notable for the following components:   APPearance HAZY (*)    Hgb urine dipstick LARGE (*)    Protein, ur 30 (*)    Leukocytes,Ua TRACE (*)    All other components within normal limits  PREGNANCY, URINE  SAMPLE TO BLOOD BANK  PREPARE RBC (CROSSMATCH)  TYPE AND SCREEN  ABO/RH    EKG None  Radiology No results found.  Procedures .Critical Care  Performed by: Antony Madura, PA-C Authorized by: Antony Madura, PA-C   Critical care provider statement:    Critical care time (minutes):  30   Critical care was necessary to treat or prevent imminent or life-threatening deterioration of the following conditions:  Circulatory failure (symptomatic anemia)   Critical care was time spent personally by me on the following activities:  Development of treatment plan with patient or surrogate,  discussions with consultants, evaluation of patient's response to treatment, examination of patient, ordering and review of laboratory studies, ordering and review of radiographic studies, ordering and performing treatments and interventions, pulse oximetry, re-evaluation of patient's condition and review of old charts   I assumed direction of critical care for this patient from another provider in my specialty: no       Medications Ordered in ED Medications  medroxyPROGESTERone (PROVERA) tablet 10 mg (10 mg Oral Given 04/11/22 0350)  ketorolac (TORADOL) 15 MG/ML injection 15 mg (15 mg Intravenous Given 04/11/22 0201)  sodium chloride 0.9 % bolus 1,000 mL (0 mLs Intravenous Stopped 04/11/22 0224)  0.9 %  sodium chloride infusion (  Manually program via Guardrails IV Fluids) ( Intravenous New Bag/Given 04/11/22 0504)    ED Course/ Medical Decision Making/ A&P Clinical Course as of 04/11/22 0617  Sun Apr 11, 2022  0209 [facility] He recommends Provera 10mg  daily with first dose in the ED. Agrees with transfusion of PRBCs. Patient able to call office Monday for follow up during the week to discuss next steps and definitive care. [KH]    Clinical Course User Index [KH] Antony MaduraHumes, Orva Riles, PA-C                             Medical Decision Making Amount and/or Complexity of Data Reviewed Labs: ordered.  Risk Prescription drug management.   This patient presents to the ED for concern of vaginal bleeding, this involves an extensive number of treatment options, and is a complaint that carries with it a high risk of complications and morbidity.  The differential diagnosis includes menorrhagia vs vaginal laceration vs uterine cancer.   Co morbidities that complicate the patient evaluation  Iron deficiency anemia Fibroids, uterine   Additional history obtained:  External records from outside source obtained and reviewed including outpatient OBGYN visit from  04/06/22.   Lab Tests:  I Ordered, and personally interpreted labs.  The pertinent results include:  Hgb 5.8 (baseline ~10), K 3.2   Cardiac Monitoring:  The patient was maintained on a cardiac monitor.  I personally viewed and interpreted the cardiac monitored which showed an underlying rhythm of: sinus tachycardia > NSR   Medicines ordered and prescription drug management:  I ordered medication including Toradol for pain, Provera for menorrhagia Reevaluation of the patient after these medicines showed that the patient improved I have reviewed the patients home medicines and have made adjustments as needed   Test Considered:  Pelvic US - however, per patient, this was completed in the office on 04/06/22 and was reassuring   Consultations Obtained:  I requested consultation with Dr. Jackelyn KnifeMeisinger of OBGYN and discussed lab and imaging findings as well as pertinent plan - they recommend: Provera 10mg  QD with outpatient follow up after completion of PRBC transfusion   Problem List / ED Course:  As above   Reevaluation:  After the interventions noted above, I reevaluated the patient and found that they have :stayed the same   Social Determinants of Health:  Insured patient   Dispostion:  After consideration of the diagnostic results and the patients response to treatment, I feel that the patent would benefit from outpatient OBGYN follow up for definitive management of metrorrhagia. Given 1 month supply of Provera for management of ongoing vaginal bleeding.   Patient signed out to [place], [place] pending completion of PRBC transfusion.         Final Clinical Impression(s) / ED Diagnoses Final diagnoses:  Symptomatic anemia  Perimenopausal menorrhagia    Rx / DC Orders ED Discharge Orders          Ordered    medroxyPROGESTERone (PROVERA) 5 MG tablet  Daily        04/11/22 0509              Antony MaduraHumes, Lowry Bala, PA-C 04/11/22 0622    Antony MaduraHumes, Numan Zylstra,  PA-C 04/11/22 0622    Mesner, Barbara CowerJason, MD 04/11/22 [phone]

## 2022-04-11 NOTE — ED Triage Notes (Signed)
Pt arrived via [facility] from home with c/c of Back Pain. Per EMS pt has had some lower back pain radiating to left lower abdominal area. Denies any urinary changes. Pt has had some heavy vaginal bleeding which isn't abnormal for her. Pt had a D&C with possible hysterectomy. Pt stated she is also have someone N/V. Pt took tramadol prior to coming and states there was no relief.  150 fentanyl, 4mg  Zofran 134/58, 100HR, 99%, CBG 116

## 2022-04-11 NOTE — ED Provider Notes (Signed)
  Physical Exam  BP (!) 129/48   Pulse 90   Temp 98.5 F (36.9 C) (Oral)   Resp 18   Ht 5\' 4"  (1.626 m)   Wt 81.6 kg   SpO2 100%   BMI 30.90 kg/m   Physical Exam Vitals and nursing note reviewed.  Constitutional:      Appearance: Normal appearance.  Eyes:     General: No scleral icterus. Pulmonary:     Effort: Pulmonary effort is normal. No respiratory distress.  Skin:    General: Skin is dry.     Findings: No rash.  Neurological:     General: No focal deficit present.     Mental Status: She is alert. Mental status is at baseline.  Psychiatric:        Mood and Affect: Mood normal.    Procedures  Procedures  ED Course / MDM   Clinical Course as of 04/11/22 5498  [facility] Apr 11, 2022  0209 Spoke with Dr. Jackelyn Knife of [facility]. He recommends Provera 10mg  daily with first dose in the ED. Agrees with transfusion of PRBCs. Patient able to call office Monday for follow up during the week to discuss next steps and definitive care. [KH]  0902 On reevaluation, patient is resting comfortably but awakens to voice.  She reports that she is feeling much better and is eating some crackers and is in no acute distress.  Discussed with her that the second bag started and that this may take a few hours to complete.  Patient has no complaints at this time. [RR]    Clinical Course User Index [KH] Antony Madura, PA-C [RR] Achille Rich, PA-C   Medical Decision Making Amount and/or Complexity of Data Reviewed Labs: ordered.  Risk Prescription drug management.   Accepted handoff at shift change from [facility], [place]. Please see prior provider note for more detail.   Briefly: Patient is 57 y.o. F presents with heavy menstrual bleeding.  Now having some nausea and vomiting.  DDX: concern for anemia  Plan: Transfuse 2 units of blood  On my reevaluation, patient was able to tolerate foods and fluids without any emesis.  She reports that she is feeling much better.  Second bag of  blood is running right now.  Will reevaluate.  Second unit of blood is finished.  Patient reports that she is feeling much better and notices significant difference even walking to the bathroom.  We discussed with her her new prescription of the Provera.  Discussed that she needs to follow-up with her OB/GYN.  We discussed tricked return precautions and red flag symptoms.  Patient verbalized her understanding and agrees to the plan.  Patient is stable and being discharged home in good condition.    Achille Rich, PA-C 04/11/22 1034    [address], [place], DO 04/11/22 1551

## 2022-04-11 NOTE — Discharge Instructions (Signed)
Take Provera as prescribed and call to follow-up with your OB/GYN this week.  We also recommend taking over-the-counter iron supplements and having your hemoglobin rechecked in 1 to 2 weeks.  Return to the ED for new or concerning symptoms.

## 2022-04-12 LAB — BPAM RBC
ISSUE DATE / TIME: 202404070501
Unit Type and Rh: 6200

## 2022-04-12 LAB — TYPE AND SCREEN
ABO/RH(D): A POS
Antibody Screen: NEGATIVE
Unit division: 0
Unit division: 0

## 2022-04-16 DIAGNOSIS — Z01419 Encounter for gynecological examination (general) (routine) without abnormal findings: Secondary | ICD-10-CM | POA: Diagnosis not present

## 2022-04-16 DIAGNOSIS — Z1389 Encounter for screening for other disorder: Secondary | ICD-10-CM | POA: Diagnosis not present

## 2022-04-16 DIAGNOSIS — N939 Abnormal uterine and vaginal bleeding, unspecified: Secondary | ICD-10-CM | POA: Diagnosis not present

## 2022-04-16 DIAGNOSIS — N920 Excessive and frequent menstruation with regular cycle: Secondary | ICD-10-CM | POA: Diagnosis not present

## 2022-06-01 ENCOUNTER — Encounter (HOSPITAL_BASED_OUTPATIENT_CLINIC_OR_DEPARTMENT_OTHER): Payer: Self-pay | Admitting: Obstetrics and Gynecology

## 2022-06-08 ENCOUNTER — Other Ambulatory Visit: Payer: Self-pay

## 2022-06-08 ENCOUNTER — Encounter (HOSPITAL_BASED_OUTPATIENT_CLINIC_OR_DEPARTMENT_OTHER): Payer: Self-pay | Admitting: Obstetrics and Gynecology

## 2022-06-08 DIAGNOSIS — Z01818 Encounter for other preprocedural examination: Secondary | ICD-10-CM | POA: Diagnosis not present

## 2022-06-08 NOTE — Progress Notes (Signed)
Your procedure is scheduled on Monday, 06/21/2022.  Report to [facility] AT  5:30 AM.   Call this number if you have problems the morning of surgery  :[phone].   OUR ADDRESS IS [address].  WE ARE LOCATED IN [facility].  PLEASE BRING YOUR INSURANCE CARD AND PHOTO ID DAY OF SURGERY.  ONLY 2 PEOPLE ARE ALLOWED IN  WAITING  ROOM                                      REMEMBER:  DO NOT EAT FOOD, CANDY GUM OR MINTS  AFTER MIDNIGHT THE NIGHT BEFORE YOUR SURGERY . YOU MAY HAVE CLEAR LIQUIDS FROM MIDNIGHT THE NIGHT BEFORE YOUR SURGERY UNTIL  4:30 AM. NO CLEAR LIQUIDS AFTER  4:30 AM DAY OF SURGERY.  YOU MAY  BRUSH YOUR TEETH MORNING OF SURGERY AND RINSE YOUR MOUTH OUT, NO CHEWING GUM CANDY OR MINTS.     CLEAR LIQUID DIET    Allowed      Water                                                                   Coffee and tea, regular and decaf  (NO cream or milk products of any type, may sweeten)                         Carbonated beverages, regular and diet                                    Sports drinks like Gatorade _____________________________________________________________________     TAKE ONLY THESE MEDICATIONS MORNING OF SURGERY: Nasal spray, Zyrtec                                       DO NOT WEAR JEWERLY/  METAL/  PIERCINGS (INCLUDING NO PLASTIC PIERCINGS) DO NOT WEAR LOTIONS, POWDERS, PERFUMES OR NAIL POLISH ON YOUR FINGERNAILS. TOENAIL POLISH IS OK TO WEAR. DO NOT SHAVE FOR 48 HOURS PRIOR TO DAY OF SURGERY.  CONTACTS, GLASSES, OR DENTURES MAY NOT BE WORN TO SURGERY.  REMEMBER: NO SMOKING, VAPING ,  DRUGS OR ALCOHOL FOR 24 HOURS BEFORE YOUR SURGERY.                                    [facility] IS NOT RESPONSIBLE  FOR ANY BELONGINGS.                                                                    Marland Kitchen           [facility] - Preparing for Surgery Before surgery,  you can play an important role.  Because skin is not  sterile, your skin needs to be as free of germs as possible.  You can reduce the number of germs on your skin by washing with CHG (chlorahexidine gluconate) soap before surgery.  CHG is an antiseptic cleaner which kills germs and bonds with the skin to continue killing germs even after washing. Please DO NOT use if you have an allergy to CHG or antibacterial soaps.  If your skin becomes reddened/irritated stop using the CHG and inform your nurse when you arrive at Short Stay. Do not shave (including legs and underarms) for at least 48 hours prior to the first CHG shower.  You may shave your face/neck. Please follow these instructions carefully:  1.  Shower with CHG Soap the night before surgery and the  morning of Surgery.  2.  If you choose to wash your hair, wash your hair first as usual with your  normal  shampoo.  3.  After you shampoo, rinse your hair and body thoroughly to remove the  shampoo.                                        4.  Use CHG as you would any other liquid soap.  You can apply chg directly  to the skin and wash , chg soap provided, night before and morning of your surgery.  5.  Apply the CHG Soap to your body ONLY FROM THE NECK DOWN.   Do not use on face/ open                           Wound or open sores. Avoid contact with eyes, ears mouth and genitals (private parts).                       Wash face,  Genitals (private parts) with your normal soap.             6.  Wash thoroughly, paying special attention to the area where your surgery  will be performed.  7.  Thoroughly rinse your body with warm water from the neck down.  8.  DO NOT shower/wash with your normal soap after using and rinsing off  the CHG Soap.             9.  Pat yourself dry with a clean towel.            10.  Wear clean pajamas.            11.  Place clean sheets on your bed the night of your first shower and do not  sleep with pets. Day of Surgery : Do not apply any lotions/ powders the morning of  surgery.  Please wear clean clothes to the hospital/surgery center.  IF YOU HAVE ANY SKIN IRRITATION OR PROBLEMS WITH THE SURGICAL SOAP, PLEASE GET A BAR OF GOLD DIAL SOAP AND SHOWER THE NIGHT BEFORE YOUR SURGERY AND THE MORNING OF YOUR SURGERY. PLEASE LET THE NURSE KNOW MORNING OF YOUR SURGERY IF YOU HAD ANY PROBLEMS WITH THE SURGICAL SOAP.   YOUR SURGEON MAY HAVE REQUESTED EXTENDED RECOVERY TIME AFTER YOUR SURGERY. IT COULD BE A  JUST A FEW HOURS  UP TO AN OVERNIGHT STAY.  YOUR SURGEON SHOULD HAVE DISCUSSED THIS WITH YOU PRIOR TO YOUR SURGERY.  IN THE EVENT YOU NEED TO STAY OVERNIGHT PLEASE REFER TO THE FOLLOWING GUIDELINES. YOU MAY HAVE UP TO 4 VISITORS  MAY VISIT IN THE EXTENDED RECOVERY ROOM UNTIL 800 PM ONLY.  ONE  VISITOR AGE 109 AND OVER MAY SPEND THE NIGHT AND MUST BE IN EXTENDED RECOVERY ROOM NO LATER THAN 800 PM . YOUR DISCHARGE TIME AFTER YOU SPEND THE NIGHT IS 900 AM THE MORNING AFTER YOUR SURGERY. YOU MAY PACK A SMALL OVERNIGHT BAG WITH TOILETRIES FOR YOUR OVERNIGHT STAY IF YOU WISH.  REGARDLESS OF IF YOU STAY OVER NIGHT OR ARE DISCHARGED THE SAME DAY YOU WILL BE REQUIRED TO HAVE A RESPONSIBLE ADULT (18 YRS OLD OR OLDER) STAY WITH YOU FOR AT LEAST THE FIRST 24 HOURS  YOUR PRESCRIPTION MEDICATIONS WILL BE PROVIDED DURING YOUR HOSPITAL STAY.  ________________________________________________________________________                                                        QUESTIONS Mechele Claude PRE OP NURSE PHONE [phone].

## 2022-06-08 NOTE — Progress Notes (Signed)
Spoke w/ via phone for pre-op interview---[facility] needs dos----type & screen (Patient had a blood transfusion on 04/11/2022), urine pregnancy               Lab results------06/15/2022 cbc, cmp COVID test -----patient states asymptomatic no test needed Arrive at -------0530 on Monday, 06/21/2022 NPO after MN NO Solid Food.  Clear liquids from MN until---0430 Med rec completed Medications to take morning of surgery -----Nasal spray prn, Zyrtec Diabetic medication -----n/a Patient instructed no nail polish to be worn day of surgery Patient instructed to bring photo id and insurance card day of surgery Patient aware to have Driver (ride ) / caregiver    for 24 hours after surgery - daughter, Feliberto Harts Patient Special Instructions -----Extended / overnight stay instructions given. Pre-Op special Instructions -----none Patient verbalized understanding of instructions that were given at this phone interview. Patient denies shortness of breath, chest pain, fever, cough at this phone interview.

## 2022-06-15 ENCOUNTER — Encounter (HOSPITAL_COMMUNITY)
Admission: RE | Admit: 2022-06-15 | Discharge: 2022-06-15 | Disposition: A | Discharge status: Home / Self Care | Payer: BC Managed Care – PPO | Source: Ambulatory Visit | Attending: Obstetrics and Gynecology | Admitting: Obstetrics and Gynecology

## 2022-06-15 DIAGNOSIS — Z01818 Encounter for other preprocedural examination: Secondary | ICD-10-CM

## 2022-06-15 DIAGNOSIS — Z01812 Encounter for preprocedural laboratory examination: Secondary | ICD-10-CM | POA: Insufficient documentation

## 2022-06-15 LAB — CBC
HCT: 36.9 % (ref 36.0–46.0)
Hemoglobin: 10.9 g/dL — ABNORMAL LOW (ref 12.0–15.0)
MCH: 23.2 pg — ABNORMAL LOW (ref 26.0–34.0)
MCHC: 29.5 g/dL — ABNORMAL LOW (ref 30.0–36.0)
MCV: 78.5 fL — ABNORMAL LOW (ref 80.0–100.0)
Platelets: 303 10*3/uL (ref 150–400)
RBC: 4.7 MIL/uL (ref 3.87–5.11)
RDW: 16.1 % — ABNORMAL HIGH (ref 11.5–15.5)
WBC: 6.6 10*3/uL (ref 4.0–10.5)
nRBC: 0 % (ref 0.0–0.2)

## 2022-06-15 LAB — COMPREHENSIVE METABOLIC PANEL
ALT: 16 U/L (ref 0–44)
AST: 21 U/L (ref 15–41)
Albumin: 4.2 g/dL (ref 3.5–5.0)
Alkaline Phosphatase: 116 U/L (ref 38–126)
Anion gap: 7 (ref 5–15)
BUN: 8 mg/dL (ref 6–20)
CO2: 26 mmol/L (ref 22–32)
Calcium: 9.3 mg/dL (ref 8.9–10.3)
Chloride: 106 mmol/L (ref 98–111)
Creatinine, Ser: 0.64 mg/dL (ref 0.44–1.00)
GFR, Estimated: >60 mL/min (ref >60)
Glucose, Bld: 101 mg/dL — ABNORMAL HIGH (ref 70–99)
Potassium: 3.9 mmol/L (ref 3.5–5.1)
Sodium: 139 mmol/L (ref 135–145)
Total Bilirubin: 1 mg/dL (ref 0.3–1.2)
Total Protein: 7.7 g/dL (ref 6.5–8.1)

## 2022-06-19 NOTE — H&P (Signed)
Melissa Downs is an 57 y.o. female [phone] with menorrhagia to anemia for definitive management.  Pt with continued heavy menses.  Uterus enlarged with LUS fibroid, thickened EMS.  Normal endometrial biopsy.  D/W pt r/b/a of RA TLH/BSO, also process and expectations, wishes to proceed.    Pertinent Gynecological History: Last pap ASCUS HR HPV neg, neg EMB 4/24 MMG 12/23/21 BiRads 1 HSV 2 G4 P2022, SVD x 2, also TAB and SAB  Menstrual History: No LMP recorded (lmp unknown). (Menstrual status: Irregular Periods).    Past Medical History:  Diagnosis Date   Abnormal uterine bleeding (AUB)    Encounter for blood transfusion 04/11/2022   Hgb 5.8, received 2 units of blood   Iron deficiency anemia due to chronic blood loss    previous seen by dr Bertis Ruddy (hematoloy/ oncology) lov note in epic 10-26-2019 and last iron infusion 11-02-2019   Leiomyoma of uterus    Ovarian cyst    left   PMB (postmenopausal bleeding) 04/27/2021   Scoliosis    Thickened endometrium 04/27/2021   Wears glasses     Past Surgical History:  Procedure Laterality Date   COLONOSCOPY  2018   HYSTEROSCOPY WITH D & C N/A 04/27/2021   Procedure: DILATATION AND CURETTAGE /HYSTEROSCOPY/SUCTION D&E;  Surgeon: Sherian Rein, MD;  Location: [facility];  Service: Gynecology;  Laterality: N/A;  D&C x 2  FH: HTN, liver transplant  Social History:  reports that she has never smoked. She has never used smokeless tobacco. She reports current alcohol use. She reports that she does not use drugs. Married, works for [employer]  Allergies:  Allergies  Allergen Reactions   Tramadol Nausea And Vomiting    Severe  N & V. Had to go to hospital.   Coconut (Cocos Nucifera) Itching    Meds Iron, ibuprofen, provera, sronyx, MVI, Zyrtec  Review of Systems  Constitutional: Negative.   HENT: Negative.    Respiratory: Negative.    Gastrointestinal: Negative.   Genitourinary:  Positive for menstrual problem  and pelvic pain.  Musculoskeletal: Negative.   Skin: Negative.   Neurological: Negative.   Psychiatric/Behavioral: Negative.      Weight 77.6 kg. Physical Exam Constitutional:      Appearance: Normal appearance.  HENT:     Head: Normocephalic and atraumatic.  Cardiovascular:     Rate and Rhythm: Normal rate and regular rhythm.  Pulmonary:     Effort: Pulmonary effort is normal.     Breath sounds: Normal breath sounds.  Abdominal:     General: Bowel sounds are normal.     Palpations: Abdomen is soft.  Genitourinary:    General: Normal vulva.     Rectum: Normal.  Musculoskeletal:        General: Normal range of motion.     Cervical back: Normal range of motion and neck supple.  Skin:    General: Skin is warm and dry.  Neurological:     General: No focal deficit present.     Mental Status: She is alert and oriented to person, place, and time.  Psychiatric:        Mood and Affect: Mood normal.        Behavior: Behavior normal.    [place] - enlarged uterus, thickened EMS, 14x10x10cm, 2cm LUS fibroid, ovaries not seen well EMB benign  Hgb improved to 10.9 preop   Assessment/Plan: [id number] with menorrhagia to anemia, for RA TLH/BSO, poss cysto D/w pt r/b/a, process and expectations Known anemia, low  normal Hgb preop Ancef for prophylaxis  Melissa Downs 06/19/2022, 5:58 PM

## 2022-06-20 NOTE — Anesthesia Preprocedure Evaluation (Signed)
Anesthesia Evaluation  Patient identified by MRN, date of birth, ID band Patient awake    Reviewed: Allergy & Precautions, NPO status , Patient's Chart, lab work & pertinent test results  History of Anesthesia Complications Negative for: history of anesthetic complications  Airway Mallampati: II  TM Distance: >3 FB Neck ROM: Full    Dental  (+) Dental Advisory Given   Pulmonary neg pulmonary ROS   Pulmonary exam normal        Cardiovascular negative cardio ROS Normal cardiovascular exam     Neuro/Psych negative neurological ROS  negative psych ROS   GI/Hepatic negative GI ROS, Neg liver ROS,,,  Endo/Other   Obesity   Renal/GU negative Renal ROS     Musculoskeletal  Scoliosis    Abdominal   Peds  Hematology  (+) Blood dyscrasia, anemia   Anesthesia Other Findings   Reproductive/Obstetrics  Fibroids                              Anesthesia Physical Anesthesia Plan  ASA: 2  Anesthesia Plan: General   Post-op Pain Management: Tylenol PO (pre-op)* and Celebrex PO (pre-op)*   Induction: Intravenous  PONV Risk Score and Plan: 4 or greater and Treatment may vary due to age or medical condition, Ondansetron, Scopolamine patch - Pre-op, Midazolam and Dexamethasone  Airway Management Planned: Oral ETT  Additional Equipment: None  Intra-op Plan:   Post-operative Plan: Extubation in OR  Informed Consent: I have reviewed the patients History and Physical, chart, labs and discussed the procedure including the risks, benefits and alternatives for the proposed anesthesia with the patient or authorized representative who has indicated his/her understanding and acceptance.     Dental advisory given  Plan Discussed with: CRNA and Anesthesiologist  Anesthesia Plan Comments:        Anesthesia Quick Evaluation

## 2022-06-21 ENCOUNTER — Ambulatory Visit (HOSPITAL_BASED_OUTPATIENT_CLINIC_OR_DEPARTMENT_OTHER): Payer: BC Managed Care – PPO | Admitting: Anesthesiology

## 2022-06-21 ENCOUNTER — Other Ambulatory Visit: Payer: Self-pay

## 2022-06-21 ENCOUNTER — Encounter (HOSPITAL_BASED_OUTPATIENT_CLINIC_OR_DEPARTMENT_OTHER)
Admission: RE | Disposition: A | Discharge status: Home / Self Care | Payer: Self-pay | Source: Home / Self Care | Attending: Obstetrics and Gynecology

## 2022-06-21 ENCOUNTER — Encounter (HOSPITAL_BASED_OUTPATIENT_CLINIC_OR_DEPARTMENT_OTHER): Payer: Self-pay | Admitting: Obstetrics and Gynecology

## 2022-06-21 ENCOUNTER — Observation Stay (HOSPITAL_BASED_OUTPATIENT_CLINIC_OR_DEPARTMENT_OTHER)
Admission: RE | Admit: 2022-06-21 | Discharge: 2022-06-21 | Disposition: A | Discharge status: Home / Self Care | Payer: BC Managed Care – PPO | Attending: Obstetrics and Gynecology | Admitting: Obstetrics and Gynecology

## 2022-06-21 DIAGNOSIS — D259 Leiomyoma of uterus, unspecified: Principal | ICD-10-CM | POA: Insufficient documentation

## 2022-06-21 DIAGNOSIS — Z9889 Other specified postprocedural states: Secondary | ICD-10-CM

## 2022-06-21 DIAGNOSIS — N852 Hypertrophy of uterus: Secondary | ICD-10-CM | POA: Diagnosis not present

## 2022-06-21 DIAGNOSIS — N921 Excessive and frequent menstruation with irregular cycle: Secondary | ICD-10-CM | POA: Insufficient documentation

## 2022-06-21 DIAGNOSIS — N72 Inflammatory disease of cervix uteri: Secondary | ICD-10-CM | POA: Diagnosis not present

## 2022-06-21 DIAGNOSIS — N8003 Adenomyosis of the uterus: Secondary | ICD-10-CM | POA: Diagnosis not present

## 2022-06-21 DIAGNOSIS — S3141XA Laceration without foreign body of vagina and vulva, initial encounter: Secondary | ICD-10-CM | POA: Diagnosis not present

## 2022-06-21 DIAGNOSIS — D630 Anemia in neoplastic disease: Secondary | ICD-10-CM | POA: Diagnosis not present

## 2022-06-21 DIAGNOSIS — N920 Excessive and frequent menstruation with regular cycle: Secondary | ICD-10-CM | POA: Diagnosis not present

## 2022-06-21 DIAGNOSIS — Z01818 Encounter for other preprocedural examination: Secondary | ICD-10-CM

## 2022-06-21 HISTORY — PX: ROBOTIC ASSISTED TOTAL HYSTERECTOMY WITH BILATERAL SALPINGO OOPHERECTOMY: SHX6086

## 2022-06-21 HISTORY — PX: CYSTOSCOPY: SHX5120

## 2022-06-21 LAB — BASIC METABOLIC PANEL
Anion gap: 10 (ref 5–15)
BUN: 11 mg/dL (ref 6–20)
CO2: 19 mmol/L — ABNORMAL LOW (ref 22–32)
Calcium: 8.7 mg/dL — ABNORMAL LOW (ref 8.9–10.3)
Chloride: 106 mmol/L (ref 98–111)
Creatinine, Ser: 0.8 mg/dL (ref 0.44–1.00)
GFR, Estimated: >60 mL/min (ref >60)
Glucose, Bld: 151 mg/dL — ABNORMAL HIGH (ref 70–99)
Potassium: 3.8 mmol/L (ref 3.5–5.1)
Sodium: 135 mmol/L (ref 135–145)

## 2022-06-21 LAB — TYPE AND SCREEN
ABO/RH(D): A POS
Antibody Screen: NEGATIVE

## 2022-06-21 LAB — CBC
HCT: 34.1 % — ABNORMAL LOW (ref 36.0–46.0)
Hemoglobin: 10 g/dL — ABNORMAL LOW (ref 12.0–15.0)
MCH: 23.4 pg — ABNORMAL LOW (ref 26.0–34.0)
MCHC: 29.3 g/dL — ABNORMAL LOW (ref 30.0–36.0)
MCV: 79.7 fL — ABNORMAL LOW (ref 80.0–100.0)
Platelets: 282 10*3/uL (ref 150–400)
RBC: 4.28 MIL/uL (ref 3.87–5.11)
RDW: 17.3 % — ABNORMAL HIGH (ref 11.5–15.5)
WBC: 15.3 10*3/uL — ABNORMAL HIGH (ref 4.0–10.5)
nRBC: 0 % (ref 0.0–0.2)

## 2022-06-21 LAB — POCT PREGNANCY, URINE: Preg Test, Ur: NEGATIVE

## 2022-06-21 SURGERY — HYSTERECTOMY, TOTAL, ROBOT-ASSISTED, LAPAROSCOPIC, WITH BILATERAL SALPINGO-OOPHORECTOMY
Anesthesia: General | Site: Bladder

## 2022-06-21 MED ORDER — OXYCODONE-ACETAMINOPHEN 5-325 MG PO TABS
1.0000 | ORAL_TABLET | ORAL | Status: DC | PRN
  Administered 2022-06-21 (×2): 2 via ORAL

## 2022-06-21 MED ORDER — POVIDONE-IODINE 10 % EX SWAB: 2.0000 | Freq: Once | CUTANEOUS | Status: DC

## 2022-06-21 MED ORDER — PROPOFOL 500 MG/50ML IV EMUL
INTRAVENOUS | Status: AC
  Filled 2022-06-21: qty 50

## 2022-06-21 MED ORDER — SUGAMMADEX SODIUM 200 MG/2ML IV SOLN
INTRAVENOUS | Status: DC | PRN
  Administered 2022-06-21: 330 mg via INTRAVENOUS

## 2022-06-21 MED ORDER — SIMETHICONE 80 MG PO CHEW: 80.0000 mg | CHEWABLE_TABLET | Freq: Four times a day (QID) | ORAL | Status: DC | PRN

## 2022-06-21 MED ORDER — SCOPOLAMINE 1 MG/3DAYS TD PT72
1.0000 | MEDICATED_PATCH | TRANSDERMAL | Status: DC
  Administered 2022-06-21: 1.5 mg via TRANSDERMAL

## 2022-06-21 MED ORDER — ROCURONIUM BROMIDE 10 MG/ML (PF) SYRINGE
PREFILLED_SYRINGE | INTRAVENOUS | Status: DC | PRN
  Administered 2022-06-21: 60 mg via INTRAVENOUS
  Administered 2022-06-21 (×3): 20 mg via INTRAVENOUS

## 2022-06-21 MED ORDER — NAPROXEN 500 MG PO TABS: 500.0000 mg | ORAL_TABLET | Freq: Two times a day (BID) | ORAL | 1 refills | Status: AC | PRN

## 2022-06-21 MED ORDER — GUAIFENESIN 100 MG/5ML PO LIQD: 15.0000 mL | ORAL | Status: DC | PRN

## 2022-06-21 MED ORDER — LIDOCAINE 2% (20 MG/ML) 5 ML SYRINGE
INTRAMUSCULAR | Status: DC | PRN
  Administered 2022-06-21: 50 mg via INTRAVENOUS

## 2022-06-21 MED ORDER — DEXAMETHASONE SODIUM PHOSPHATE 10 MG/ML IJ SOLN
INTRAMUSCULAR | Status: AC
  Filled 2022-06-21: qty 1

## 2022-06-21 MED ORDER — LACTATED RINGERS IV SOLN: INTRAVENOUS | Status: DC

## 2022-06-21 MED ORDER — HYDROMORPHONE 1 MG/ML IV SOLN: INTRAVENOUS | Status: DC

## 2022-06-21 MED ORDER — ONDANSETRON HCL 4 MG/2ML IJ SOLN: 4.0000 mg | Freq: Four times a day (QID) | INTRAMUSCULAR | Status: DC | PRN

## 2022-06-21 MED ORDER — GABAPENTIN 300 MG PO CAPS
300.0000 mg | ORAL_CAPSULE | ORAL | Status: AC
  Administered 2022-06-21: 300 mg via ORAL

## 2022-06-21 MED ORDER — DEXMEDETOMIDINE HCL IN NACL 80 MCG/20ML IV SOLN
INTRAVENOUS | Status: AC
  Filled 2022-06-21: qty 20

## 2022-06-21 MED ORDER — FENTANYL CITRATE (PF) 100 MCG/2ML IJ SOLN
INTRAMUSCULAR | Status: AC
  Filled 2022-06-21: qty 2

## 2022-06-21 MED ORDER — LIDOCAINE HCL (PF) 2 % IJ SOLN
INTRAMUSCULAR | Status: DC | PRN
  Administered 2022-06-21: 1.5 mg/kg/h via INTRADERMAL

## 2022-06-21 MED ORDER — ACETAMINOPHEN 500 MG PO TABS
1000.0000 mg | ORAL_TABLET | ORAL | Status: AC
  Administered 2022-06-21: 1000 mg via ORAL

## 2022-06-21 MED ORDER — ONDANSETRON HCL 4 MG/2ML IJ SOLN
INTRAMUSCULAR | Status: DC | PRN
  Administered 2022-06-21: 4 mg via INTRAVENOUS

## 2022-06-21 MED ORDER — SODIUM CHLORIDE 0.9 % IR SOLN
Status: DC | PRN
  Administered 2022-06-21: 600 mL

## 2022-06-21 MED ORDER — ALBUMIN HUMAN 5 % IV SOLN
INTRAVENOUS | Status: AC
  Filled 2022-06-21: qty 250

## 2022-06-21 MED ORDER — LIDOCAINE HCL (PF) 2 % IJ SOLN
INTRAMUSCULAR | Status: AC
  Filled 2022-06-21: qty 10

## 2022-06-21 MED ORDER — DIPHENHYDRAMINE HCL 12.5 MG/5ML PO ELIX: 12.5000 mg | ORAL_SOLUTION | Freq: Four times a day (QID) | ORAL | Status: DC | PRN

## 2022-06-21 MED ORDER — IBUPROFEN 800 MG PO TABS: 800.0000 mg | ORAL_TABLET | Freq: Three times a day (TID) | ORAL | Status: DC | PRN

## 2022-06-21 MED ORDER — MIDAZOLAM HCL 2 MG/2ML IJ SOLN
INTRAMUSCULAR | Status: AC
  Filled 2022-06-21: qty 2

## 2022-06-21 MED ORDER — NALOXONE HCL 0.4 MG/ML IJ SOLN: 0.4000 mg | INTRAMUSCULAR | Status: DC | PRN

## 2022-06-21 MED ORDER — ACETAMINOPHEN 500 MG PO TABS
ORAL_TABLET | ORAL | Status: AC
  Filled 2022-06-21: qty 2

## 2022-06-21 MED ORDER — GABAPENTIN 300 MG PO CAPS
ORAL_CAPSULE | ORAL | Status: AC
  Filled 2022-06-21: qty 1

## 2022-06-21 MED ORDER — HYDROMORPHONE HCL 1 MG/ML IJ SOLN
0.2000 mg | INTRAMUSCULAR | Status: DC | PRN
  Administered 2022-06-21: 0.5 mg via INTRAVENOUS

## 2022-06-21 MED ORDER — ALBUMIN HUMAN 5 % IV SOLN: INTRAVENOUS | Status: DC | PRN

## 2022-06-21 MED ORDER — ONDANSETRON HCL 4 MG PO TABS: 4.0000 mg | ORAL_TABLET | Freq: Four times a day (QID) | ORAL | Status: DC | PRN

## 2022-06-21 MED ORDER — LIDOCAINE HCL (PF) 2 % IJ SOLN
INTRAMUSCULAR | Status: AC
  Filled 2022-06-21: qty 5

## 2022-06-21 MED ORDER — DEXAMETHASONE SODIUM PHOSPHATE 10 MG/ML IJ SOLN
INTRAMUSCULAR | Status: DC | PRN
  Administered 2022-06-21: 10 mg via INTRAVENOUS

## 2022-06-21 MED ORDER — ROCURONIUM BROMIDE 10 MG/ML (PF) SYRINGE
PREFILLED_SYRINGE | INTRAVENOUS | Status: AC
  Filled 2022-06-21: qty 10

## 2022-06-21 MED ORDER — SODIUM CHLORIDE 0.9 % IR SOLN
Status: DC | PRN
  Administered 2022-06-21: 300 mL

## 2022-06-21 MED ORDER — CELECOXIB 200 MG PO CAPS
200.0000 mg | ORAL_CAPSULE | Freq: Once | ORAL | Status: AC
  Administered 2022-06-21: 200 mg via ORAL

## 2022-06-21 MED ORDER — SODIUM CHLORIDE 0.9% FLUSH: 9.0000 mL | INTRAVENOUS | Status: DC | PRN

## 2022-06-21 MED ORDER — WHITE PETROLATUM EX OINT
TOPICAL_OINTMENT | CUTANEOUS | Status: AC
  Filled 2022-06-21: qty 5

## 2022-06-21 MED ORDER — DEXMEDETOMIDINE HCL IN NACL 80 MCG/20ML IV SOLN
INTRAVENOUS | Status: DC | PRN
  Administered 2022-06-21: 4 ug via INTRAVENOUS
  Administered 2022-06-21: 12 ug via INTRAVENOUS
  Administered 2022-06-21 (×2): 8 ug via INTRAVENOUS

## 2022-06-21 MED ORDER — OXYCODONE-ACETAMINOPHEN 5-325 MG PO TABS
ORAL_TABLET | ORAL | Status: AC
  Filled 2022-06-21: qty 2

## 2022-06-21 MED ORDER — EPHEDRINE SULFATE (PRESSORS) 50 MG/ML IJ SOLN
INTRAMUSCULAR | Status: DC | PRN
  Administered 2022-06-21: 10 mg via INTRAVENOUS

## 2022-06-21 MED ORDER — EPHEDRINE 5 MG/ML INJ
INTRAVENOUS | Status: AC
  Filled 2022-06-21: qty 5

## 2022-06-21 MED ORDER — OXYCODONE-ACETAMINOPHEN 5-325 MG PO TABS: 1.0000 | ORAL_TABLET | Freq: Four times a day (QID) | ORAL | 0 refills | Status: AC | PRN

## 2022-06-21 MED ORDER — FENTANYL CITRATE (PF) 250 MCG/5ML IJ SOLN
INTRAMUSCULAR | Status: AC
  Filled 2022-06-21: qty 5

## 2022-06-21 MED ORDER — MENTHOL 3 MG MT LOZG: 1.0000 | LOZENGE | OROMUCOSAL | Status: DC | PRN

## 2022-06-21 MED ORDER — OXYCODONE HCL 5 MG PO TABS: 5.0000 mg | ORAL_TABLET | Freq: Once | ORAL | Status: DC | PRN

## 2022-06-21 MED ORDER — OXYCODONE HCL 5 MG/5ML PO SOLN: 5.0000 mg | Freq: Once | ORAL | Status: DC | PRN

## 2022-06-21 MED ORDER — HYDROMORPHONE HCL 1 MG/ML IJ SOLN
INTRAMUSCULAR | Status: AC
  Filled 2022-06-21: qty 1

## 2022-06-21 MED ORDER — MIDAZOLAM HCL 5 MG/5ML IJ SOLN
INTRAMUSCULAR | Status: DC | PRN
  Administered 2022-06-21: 2 mg via INTRAVENOUS

## 2022-06-21 MED ORDER — PROMETHAZINE HCL 25 MG/ML IJ SOLN: 6.2500 mg | INTRAMUSCULAR | Status: DC | PRN

## 2022-06-21 MED ORDER — STERILE WATER FOR IRRIGATION IR SOLN
Status: DC | PRN
  Administered 2022-06-21: 500 mL

## 2022-06-21 MED ORDER — ONDANSETRON HCL 4 MG/2ML IJ SOLN
INTRAMUSCULAR | Status: AC
  Filled 2022-06-21: qty 2

## 2022-06-21 MED ORDER — ALUM & MAG HYDROXIDE-SIMETH 200-200-20 MG/5ML PO SUSP: 30.0000 mL | ORAL | Status: DC | PRN

## 2022-06-21 MED ORDER — SCOPOLAMINE 1 MG/3DAYS TD PT72
MEDICATED_PATCH | TRANSDERMAL | Status: AC
  Filled 2022-06-21: qty 1

## 2022-06-21 MED ORDER — CELECOXIB 200 MG PO CAPS
ORAL_CAPSULE | ORAL | Status: AC
  Filled 2022-06-21: qty 1

## 2022-06-21 MED ORDER — CEFAZOLIN SODIUM-DEXTROSE 2-4 GM/100ML-% IV SOLN
2.0000 g | INTRAVENOUS | Status: AC
  Administered 2022-06-21: 2 g via INTRAVENOUS

## 2022-06-21 MED ORDER — DIPHENHYDRAMINE HCL 50 MG/ML IJ SOLN: 12.5000 mg | Freq: Four times a day (QID) | INTRAMUSCULAR | Status: DC | PRN

## 2022-06-21 MED ORDER — CEFAZOLIN SODIUM-DEXTROSE 2-4 GM/100ML-% IV SOLN
INTRAVENOUS | Status: AC
  Filled 2022-06-21: qty 100

## 2022-06-21 MED ORDER — PHENYLEPHRINE 80 MCG/ML (10ML) SYRINGE FOR IV PUSH (FOR BLOOD PRESSURE SUPPORT)
PREFILLED_SYRINGE | INTRAVENOUS | Status: DC | PRN
  Administered 2022-06-21 (×5): 160 ug via INTRAVENOUS

## 2022-06-21 MED ORDER — BUPIVACAINE HCL (PF) 0.25 % IJ SOLN
INTRAMUSCULAR | Status: DC | PRN
  Administered 2022-06-21: 15 mL

## 2022-06-21 MED ORDER — PROPOFOL 10 MG/ML IV BOLUS
INTRAVENOUS | Status: DC | PRN
  Administered 2022-06-21: 200 mg via INTRAVENOUS

## 2022-06-21 MED ORDER — FENTANYL CITRATE (PF) 100 MCG/2ML IJ SOLN
INTRAMUSCULAR | Status: DC | PRN
  Administered 2022-06-21 (×4): 50 ug via INTRAVENOUS

## 2022-06-21 MED ORDER — FENTANYL CITRATE (PF) 100 MCG/2ML IJ SOLN
25.0000 ug | INTRAMUSCULAR | Status: DC | PRN
  Administered 2022-06-21 (×2): 50 ug via INTRAVENOUS

## 2022-06-21 SURGICAL SUPPLY — 78 items
ADH SKN CLS APL DERMABOND .7 (GAUZE/BANDAGES/DRESSINGS) ×2
APL SRG 38 LTWT LNG FL B (MISCELLANEOUS)
APPLICATOR ARISTA FLEXITIP XL (MISCELLANEOUS) IMPLANT
BLADE SURG 10 STRL SS (BLADE) IMPLANT
CANNULA CAP OBTURATR AIRSEAL 8 (CAP) ×3 IMPLANT
CANNULA REDUCER 12-8 DVNC XI (CANNULA) IMPLANT
CATH FOLEY 3WAY 5CC 16FR (CATHETERS) ×3 IMPLANT
CELLS DAT CNTRL 66122 CELL SVR (MISCELLANEOUS) IMPLANT
COVER BACK TABLE 60X90IN (DRAPES) ×3 IMPLANT
COVER TIP SHEARS 8 DVNC (MISCELLANEOUS) ×3 IMPLANT
DEFOGGER SCOPE WARMER CLEARIFY (MISCELLANEOUS) ×3 IMPLANT
DERMABOND ADVANCED .7 DNX12 (GAUZE/BANDAGES/DRESSINGS) ×3 IMPLANT
DRAPE ARM DVNC X/XI (DISPOSABLE) ×12 IMPLANT
DRAPE COLUMN DVNC XI (DISPOSABLE) ×3 IMPLANT
DRAPE SURG IRRIG POUCH 19X23 (DRAPES) ×3 IMPLANT
DRAPE UTILITY XL STRL (DRAPES) ×3 IMPLANT
DRIVER NDL MEGA SUTCUT DVNCXI (INSTRUMENTS) ×3 IMPLANT
DRIVER NDLE MEGA SUTCUT DVNCXI (INSTRUMENTS) ×2 IMPLANT
DURAPREP 26ML APPLICATOR (WOUND CARE) ×3 IMPLANT
ELECT REM PT RETURN 9FT ADLT (ELECTROSURGICAL) ×2
ELECTRODE REM PT RTRN 9FT ADLT (ELECTROSURGICAL) ×3 IMPLANT
FORCEPS BPLR FENES DVNC XI (FORCEP) ×3 IMPLANT
FORCEPS PROGRASP DVNC XI (FORCEP) IMPLANT
GAUZE 4X4 16PLY ~~LOC~~+RFID DBL (SPONGE) ×3 IMPLANT
GLOVE BIO SURGEON STRL SZ 6.5 (GLOVE) ×9 IMPLANT
HEMOSTAT ARISTA ABSORB 3G PWDR (HEMOSTASIS) IMPLANT
HIBICLENS CHG 4% 4OZ BTL (MISCELLANEOUS) IMPLANT
HOLDER FOLEY CATH W/STRAP (MISCELLANEOUS) IMPLANT
IRRIG SUCT STRYKERFLOW 2 WTIP (MISCELLANEOUS) ×2
IRRIGATION SUCT STRKRFLW 2 WTP (MISCELLANEOUS) ×3 IMPLANT
IV NS 1000ML (IV SOLUTION) ×2
IV NS 1000ML BAXH (IV SOLUTION) IMPLANT
KIT PINK PAD W/HEAD ARE REST (MISCELLANEOUS) ×2
KIT PINK PAD W/HEAD ARM REST (MISCELLANEOUS) ×3 IMPLANT
KIT TURNOVER CYSTO (KITS) ×3 IMPLANT
LEGGING LITHOTOMY PAIR STRL (DRAPES) ×3 IMPLANT
MANIFOLD NEPTUNE II (INSTRUMENTS) ×3 IMPLANT
MANIPULATOR ADVINCU DEL 2.5 PL (MISCELLANEOUS) IMPLANT
MANIPULATOR ADVINCU DEL 3.0 PL (MISCELLANEOUS) IMPLANT
MANIPULATOR ADVINCU DEL 3.5 PL (MISCELLANEOUS) IMPLANT
MANIPULATOR ADVINCU DEL 4.0 PL (MISCELLANEOUS) IMPLANT
NDL INSUFFLATION 14GA 120MM (NEEDLE) ×3 IMPLANT
NEEDLE INSUFFLATION 14GA 120MM (NEEDLE) ×2 IMPLANT
OBTURATOR OPTICAL STND 8 DVNC (TROCAR) ×2
OBTURATOR OPTICALSTD 8 DVNC (TROCAR) ×3 IMPLANT
OCCLUDER COLPOPNEUMO (BALLOONS) ×3 IMPLANT
PACK ROBOT WH (CUSTOM PROCEDURE TRAY) ×3 IMPLANT
PACK ROBOTIC GOWN (GOWN DISPOSABLE) ×3 IMPLANT
PAD OB MATERNITY 4.3X12.25 (PERSONAL CARE ITEMS) ×3 IMPLANT
PAD PREP 24X48 CUFFED NSTRL (MISCELLANEOUS) ×3 IMPLANT
PROTECTOR NERVE ULNAR (MISCELLANEOUS) ×3 IMPLANT
RETRACTOR WND ALEXIS 18 MED (MISCELLANEOUS) IMPLANT
RTRCTR WOUND ALEXIS 18CM MED (MISCELLANEOUS)
RTRCTR WOUND ALEXIS 18CM SML (INSTRUMENTS)
SAVER CELL AAL HAEMONETICS (INSTRUMENTS) IMPLANT
SCISSORS MNPLR CVD DVNC XI (INSTRUMENTS) ×3 IMPLANT
SEAL UNIV 5-12 XI (MISCELLANEOUS) ×6 IMPLANT
SEALER VESSEL EXT DVNC XI (MISCELLANEOUS) IMPLANT
SET IRRIG Y TYPE TUR BLADDER L (SET/KITS/TRAYS/PACK) IMPLANT
SET TUBE FILTERED XL AIRSEAL (SET/KITS/TRAYS/PACK) ×3 IMPLANT
SLEEVE SCD COMPRESS KNEE MED (STOCKING) ×3 IMPLANT
SPIKE FLUID TRANSFER (MISCELLANEOUS) ×6 IMPLANT
SUT VIC AB 0 CT1 27 (SUTURE)
SUT VIC AB 0 CT1 27XBRD ANBCTR (SUTURE) IMPLANT
SUT VIC AB 3-0 SH 27 (SUTURE) ×2
SUT VIC AB 3-0 SH 27X BRD (SUTURE) IMPLANT
SUT VIC AB 4-0 PS2 18 (SUTURE) ×6 IMPLANT
SUT VICRYL 0 UR6 27IN ABS (SUTURE) IMPLANT
SUT VLOC 180 0 9IN GS21 (SUTURE) ×3 IMPLANT
TIP RUMI ORANGE 6.7MMX12CM (TIP) IMPLANT
TIP UTERINE 5.1X6CM LAV DISP (MISCELLANEOUS) IMPLANT
TIP UTERINE 6.7X10CM GRN DISP (MISCELLANEOUS) IMPLANT
TIP UTERINE 6.7X6CM WHT DISP (MISCELLANEOUS) IMPLANT
TIP UTERINE 6.7X8CM BLUE DISP (MISCELLANEOUS) IMPLANT
TOWEL OR 17X24 6PK STRL BLUE (TOWEL DISPOSABLE) ×3 IMPLANT
TROCAR PORT AIRSEAL 8X100 (TROCAR) IMPLANT
WATER STERILE IRR 1000ML POUR (IV SOLUTION) ×3 IMPLANT
WATER STERILE IRR 500ML POUR (IV SOLUTION) IMPLANT

## 2022-06-21 NOTE — Anesthesia Procedure Notes (Signed)
Procedure Name: Intubation Date/Time: 06/21/2022 7:28 AM  Performed by: Briant Sites, CRNAPre-anesthesia Checklist: Patient identified, Emergency Drugs available, Suction available and Patient being monitored Patient Re-evaluated:Patient Re-evaluated prior to induction Oxygen Delivery Method: Circle system utilized Preoxygenation: Pre-oxygenation with 100% oxygen Induction Type: IV induction Ventilation: Mask ventilation without difficulty Laryngoscope Size: Mac and 3 Grade View: Grade I Tube type: Oral Tube size: 7.0 mm Number of attempts: 1 Airway Equipment and Method: Stylet and Oral airway Placement Confirmation: ETT inserted through vocal cords under direct vision, positive ETCO2 and breath sounds checked- equal and bilateral Secured at: 19 cm Tube secured with: Tape Dental Injury: Teeth and Oropharynx as per pre-operative assessment

## 2022-06-21 NOTE — Interval H&P Note (Signed)
History and Physical Interval Note:  06/21/2022 7:06 AM  Melissa Downs  has presented today for surgery, with the diagnosis of menorrhagia.  The various methods of treatment have been discussed with the patient and family. After consideration of risks, benefits and other options for treatment, the patient has consented to  Procedure(s): XI ROBOTIC ASSISTED TOTAL HYSTERECTOMY WITH BILATERAL SALPINGO OOPHORECTOMY (Bilateral) CYSTOSCOPY (N/A) as a surgical intervention.  The patient's history has been reviewed, patient examined, no change in status, stable for surgery.  I have reviewed the patient's chart and labs.  Questions were answered to the patient's satisfaction.     Lovina Zuver Bovard-Stuckert

## 2022-06-21 NOTE — Progress Notes (Signed)
POD#0 - RA TLH/BSO/ cystoscopy and vaginal laceration repair  Pt doing well, pain controlled.  Ambulating and voiding w/o difficulty.  Pain controlled.  AFVSS Gen NAD CV RRR Lungs CTAB Abd soft, app tender, +BS Inc C/D/I Ext sym, NT  Hg 10.9 -> 10 Cr stable  Wil discharge to home, given appropriate discharge instructions Will d/c with oxycodone, motrin F/u 2 weeks

## 2022-06-21 NOTE — Brief Op Note (Signed)
06/21/2022  10:38 AM  PATIENT:  Melissa Downs  57 y.o. female  PRE-OPERATIVE DIAGNOSIS:  menorrhagia, fibroid, enlarged uterus  POST-OPERATIVE DIAGNOSIS:  menorrhagia, fibroid, enlarged uterus  PROCEDURE:  Procedure(s): XI ROBOTIC ASSISTED TOTAL HYSTERECTOMY WITH BILATERAL SALPINGO OOPHORECTOMY (Bilateral) CYSTOSCOPY (N/A), vaginal laceration repair.  SURGEON:  Surgeon(s) and Role:    * Bovard-Stuckert, Victorious Cosio, MD - Primary  ASSISTANTS: Webb Silversmith, RNFA and Arby Barrette FNP   ANESTHESIA:   local and general  EBL:  100 mL IVF and uop per anesthesia  DRAINS: Urinary Catheter (Foley)   LOCAL MEDICATIONS USED:  MARCAINE     SPECIMEN:  Source of Specimen:  uterus, cervix B fallopian tubes, B ovaries  DISPOSITION OF SPECIMEN:  PATHOLOGY  COUNTS:  YES  TOURNIQUET:  * No tourniquets in log *  DICTATION: .Other Dictation: Dictation Number [id number]  PLAN OF CARE:  Admit for extended recovery  PATIENT DISPOSITION:  PACU - hemodynamically stable.   Delay start of Pharmacological VTE agent (>24hrs) due to surgical blood loss or risk of bleeding: not applicable

## 2022-06-21 NOTE — Op Note (Unsigned)
NAME: Melissa Downs, Melissa Downs MEDICAL RECORD NO: [id number] ACCOUNT NO: [id number] DATE OF BIRTH: 1965/07/08 FACILITY: [facility] LOCATION: [facility] PHYSICIAN: Sherian Rein, MD  Operative Report   DATE OF PROCEDURE: 06/21/2022  PREOPERATIVE DIAGNOSES:  Menorrhagia, fibroid, enlarged uterus.  POSTOPERATIVE DIAGNOSES:  Menorrhagia, fibroid, enlarged uterus.  PROCEDURE: Da Vinci robot-assisted total laparoscopic hysterectomy, bilateral salpingo-oophorectomy, cystoscopy with vaginal laceration repair.   SURGEON: Sherian Rein, MD  ASSISTANT: Webb Silversmith RNFA and Vonzell Schlatter, family nurse practitioner.  ANESTHESIA:  Local and general.  ESTIMATED BLOOD LOSS:  100 mL.  INTRAVENOUS FLUID AND URINE OUTPUT:  Per anesthesia.  COMPLICATIONS:  Removal of the uterus was more complicated by its size and had to be morcellated through the vagina adding slight amount of time to the case.  PATHOLOGY: Uterus, cervix, bilateral fallopian tubes, bilateral ovaries.  DESCRIPTION OF PROCEDURE:  After an informed consent was reviewed with the patient including risks benefits and alternatives of the procedure, she was transported to the operating room and placed on the table in supine position.  General anesthesia was induced and found to be  adequate.  She was then prepped and draped in the normal sterile fashion.  After an appropriate timeout was performed, the patient was placed in the [place] stirrups, prepped and draped appropriately.  A foley catheter was placed in her urethra.  The open-sided speculum was placed in her vagina.  Gloves and gown were changed.  Attention was turned to the abdominal portion of the case. An approximately 8 mm supraumbilical incision was made.  The fascia was cleared with a hemostat.  The Veress needle was used to obtain pneumoperitoneum after the hanging drop test and with an opening pressure of 4 mmHg.  A trocar was placed under direct visualization.   Accessory ports were placed on both the right and left.  After checking that they were appropriately placed and the port on the right side had the  air management system, the robot was docked and the ports were docked.  The surgeon was seated at the console.  The left tube was grasped at the fimbriated end and the tube and ovary were excised to the level of the cornu.  The ureter was identified.   The pedicles were made through the round ligament and cardinal ligaments to the level of the uterine artery.  Bladder flap was created to the midline.  Attention was then turned to the right side, which in a similar fashion on the right the fimbriated  end of the tube was grasped and the tube and ovary were excised to the level of the cornu.  The round ligament was ligated.  The cardinal ligaments to the level of the uterine artery were also ligated using the vessel sealer.  The uterine artery was  ligated.  The bladder flap was created to the midline.  The cup from the Advincula was palpated and the colpotomy was performed.  The colpotomy excised the uterus.  The uterus was then delivered through the vagina. As the uterus was being delivered it  was noted to be bulky and was morcellated through the vagina for delivery.  Gloves and gown were changed.  Attention was turned back to the console where the cuff was closed with barbed suture.  Hemostasis was assured.  Copious irrigation was performed.   Again, gown and gloves were donned after scrubbing and the cystoscopy was performed revealing ureteral jets from bilateral ureters.  The robot was undocked and the ports were closed with 3-0 Vicryl and  Dermabond.  As we were closing, it was noted that  the patient had a first-degree vaginal laceration and right labial laceration.  These were repaired with 3-0 Vicryl and figure-of-eights.  The patient tolerated the procedure well.  Sponge, lap and needle count were correct x2 per the operating staff.   VAI D: 06/21/2022  6:47:54 pm T: 06/21/2022 10:00:00 pm  JOB: [id number]/ [id number]

## 2022-06-21 NOTE — Transfer of Care (Signed)
Immediate Anesthesia Transfer of Care Note  Patient: Melissa Downs  Procedure(s) Performed: XI ROBOTIC ASSISTED TOTAL HYSTERECTOMY WITH BILATERAL SALPINGO OOPHORECTOMY (Bilateral: Abdomen) CYSTOSCOPY (Bladder)  Patient Location: PACU  Anesthesia Type:General  Level of Consciousness: sedated  Airway & Oxygen Therapy: Patient connected to face mask oxygen  Post-op Assessment:  opa in place on arrival, BS and spit out by pt , O2 Sat 98%  Post vital signs: Reviewed and stable  Last Vitals:  Vitals Value Taken Time  BP 92/63 06/21/22 1121  Temp    Pulse 80 06/21/22 1128  Resp 17 06/21/22 1128  SpO2 90 % 06/21/22 1128  Vitals shown include unvalidated device data.  Last Pain:  Vitals:   06/21/22 0558  TempSrc: Oral  PainSc: 0-No pain      Patients Stated Pain Goal: 5 (06/21/22 0558)  Complications: No notable events documented.

## 2022-06-22 ENCOUNTER — Encounter (HOSPITAL_BASED_OUTPATIENT_CLINIC_OR_DEPARTMENT_OTHER): Payer: Self-pay | Admitting: Obstetrics and Gynecology

## 2022-06-22 LAB — SURGICAL PATHOLOGY

## 2022-06-22 NOTE — Anesthesia Postprocedure Evaluation (Signed)
Anesthesia Post Note  Patient: Melissa Downs  Procedure(s) Performed: XI ROBOTIC ASSISTED TOTAL HYSTERECTOMY WITH BILATERAL SALPINGO OOPHORECTOMY (Bilateral: Abdomen) CYSTOSCOPY (Bladder)     Patient location during evaluation: PACU Anesthesia Type: General Level of consciousness: awake and alert Pain management: pain level controlled Vital Signs Assessment: post-procedure vital signs reviewed and stable Respiratory status: spontaneous breathing, nonlabored ventilation and respiratory function stable Cardiovascular status: stable and blood pressure returned to baseline Anesthetic complications: no   No notable events documented.  Last Vitals:  Vitals:   06/21/22 1400 06/21/22 1745  BP: 110/67 130/70  Pulse: 90 (!) 104  Resp: 16 16  Temp: 37.2 C   SpO2: 93% 93%    Last Pain:  Vitals:   06/21/22 1745  TempSrc:   PainSc: 5                  Beryle Lathe

## 2022-06-24 NOTE — Discharge Summary (Signed)
Please refer to note 06/21/22 18:48

## 2022-08-03 DIAGNOSIS — N898 Other specified noninflammatory disorders of vagina: Secondary | ICD-10-CM | POA: Diagnosis not present

## 2022-08-11 DIAGNOSIS — N951 Menopausal and female climacteric states: Secondary | ICD-10-CM | POA: Diagnosis not present

## 2022-08-18 DIAGNOSIS — N898 Other specified noninflammatory disorders of vagina: Secondary | ICD-10-CM | POA: Diagnosis not present

## 2022-09-03 DIAGNOSIS — J309 Allergic rhinitis, unspecified: Secondary | ICD-10-CM | POA: Diagnosis not present

## 2022-09-03 DIAGNOSIS — R21 Rash and other nonspecific skin eruption: Secondary | ICD-10-CM | POA: Diagnosis not present

## 2022-10-22 ENCOUNTER — Other Ambulatory Visit: Payer: Self-pay | Admitting: Obstetrics and Gynecology

## 2022-10-22 DIAGNOSIS — Z1231 Encounter for screening mammogram for malignant neoplasm of breast: Secondary | ICD-10-CM

## 2022-11-30 DIAGNOSIS — E559 Vitamin D deficiency, unspecified: Secondary | ICD-10-CM | POA: Diagnosis not present

## 2022-11-30 DIAGNOSIS — N951 Menopausal and female climacteric states: Secondary | ICD-10-CM | POA: Diagnosis not present

## 2022-12-16 IMAGING — US US PELVIS COMPLETE TRANSABD/TRANSVAG W DUPLEX AND/OR DOPPLER
1 series · 13 of 25 positions shown · non-contrast
Comparison: None.

CLINICAL DATA: Vaginal bleed and Back pain. Last menstrual period
03/08/2021

EXAM:
TRANSABDOMINAL AND TRANSVAGINAL ULTRASOUND OF PELVIS
DOPPLER ULTRASOUND OF OVARIES
TECHNIQUE: Both transabdominal and transvaginal ultrasound examinations of the
pelvis were performed. Transabdominal technique was performed for
global imaging of the pelvis including uterus, ovaries, adnexal
regions, and pelvic cul-de-sac.
It was necessary to proceed with endovaginal exam following the
transabdominal exam to visualize the endometrium and ovaries. Color
and duplex Doppler ultrasound was utilized to evaluate blood flow to
the ovaries.

[Series 1: us pelvic complete w transvaginal and torsion righ · 79 acquisitions, 13 frames shown]
[im 1/79]
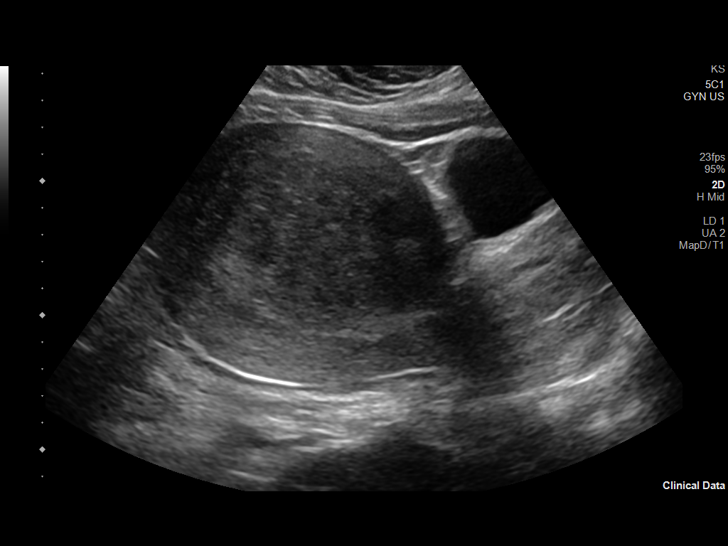
[im 7/79]
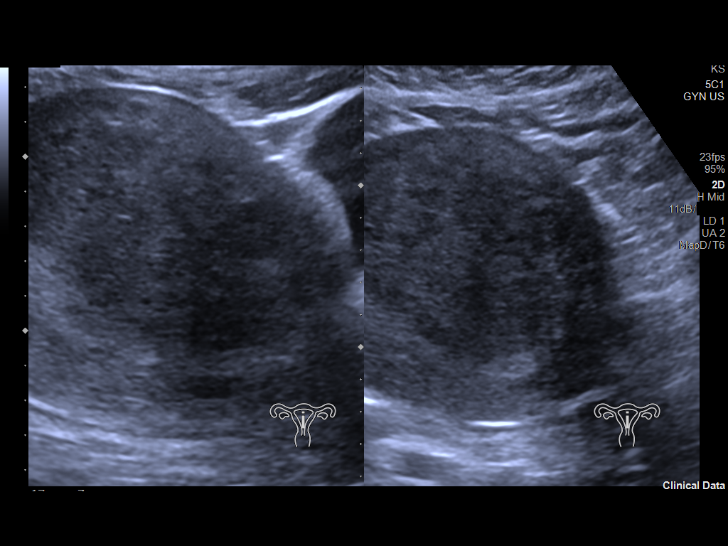
[im 14/79]
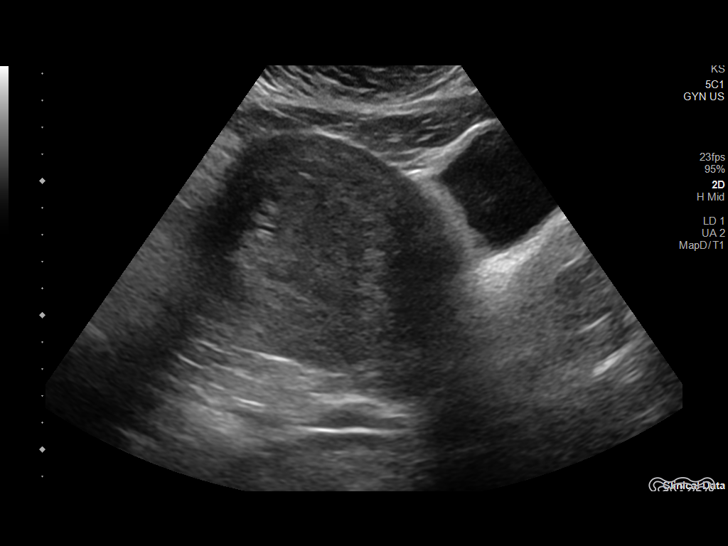
[im 20/79]
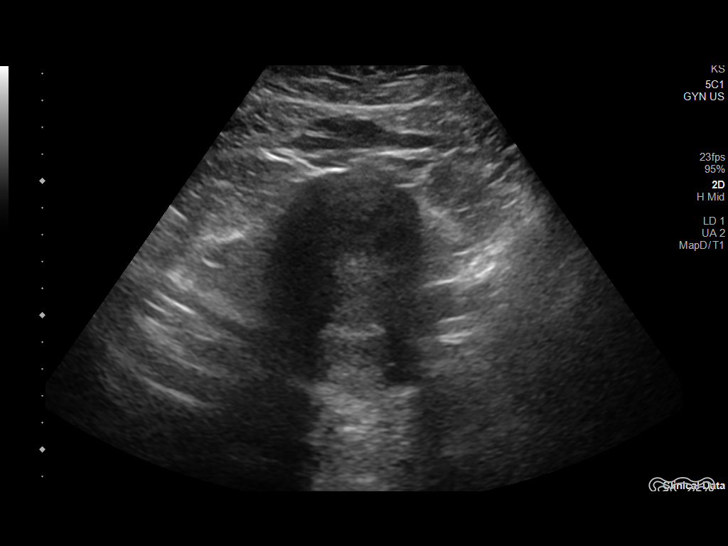
[im 27/79]
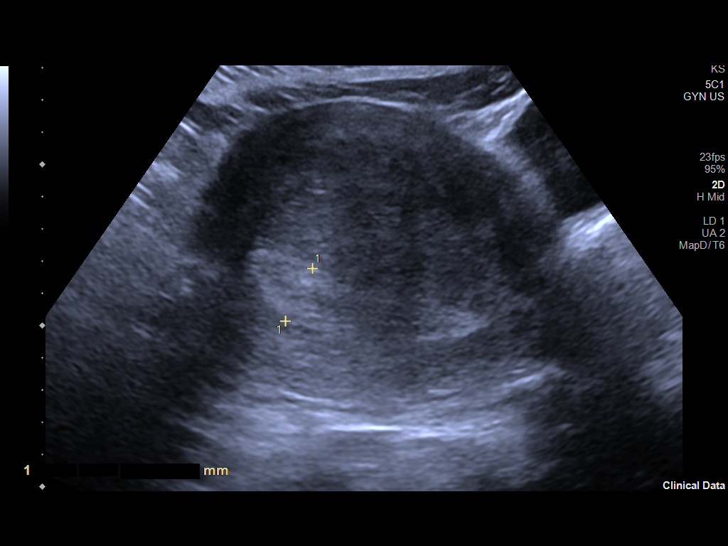
[im 33/79]
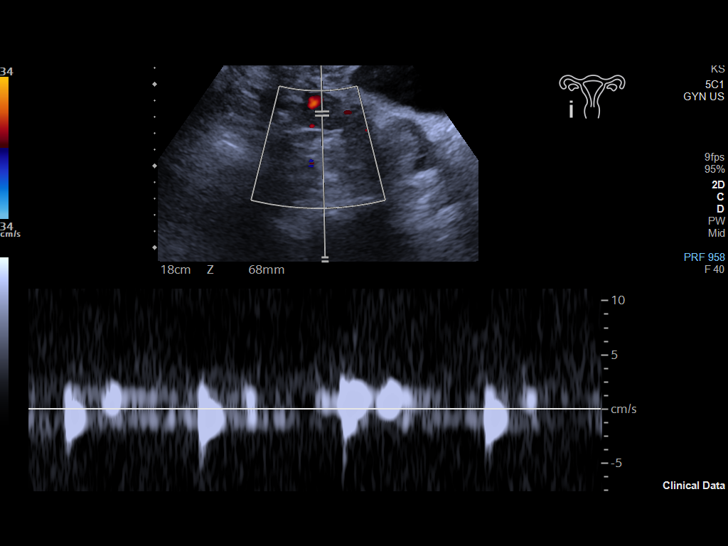
[im 40/79]
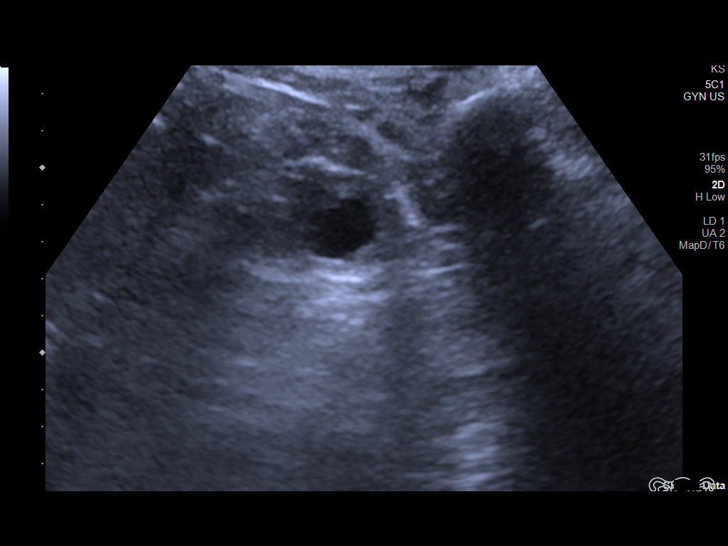
[im 46/79]
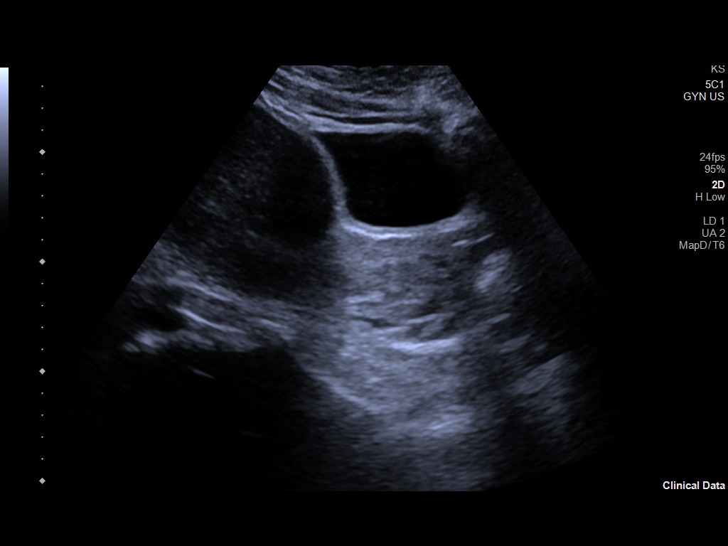
[im 53/79]
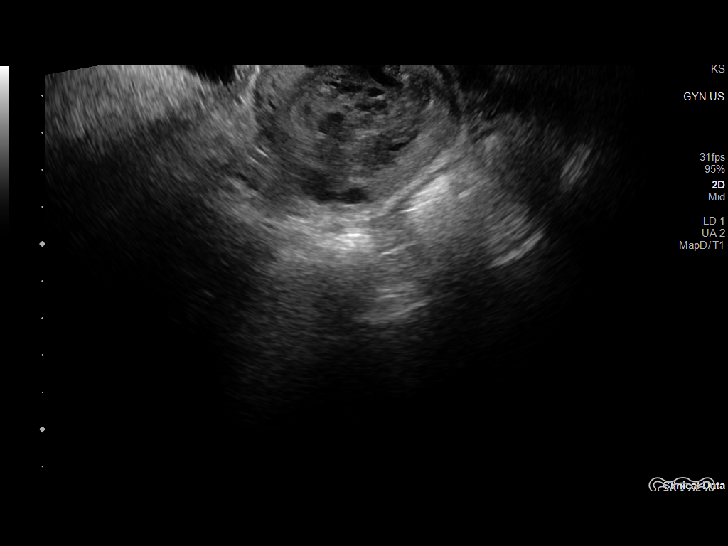
[im 59/79]
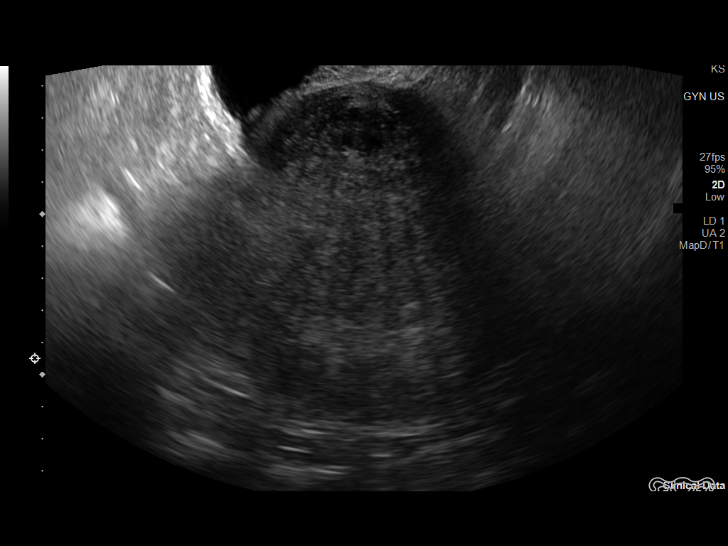
[im 66/79]
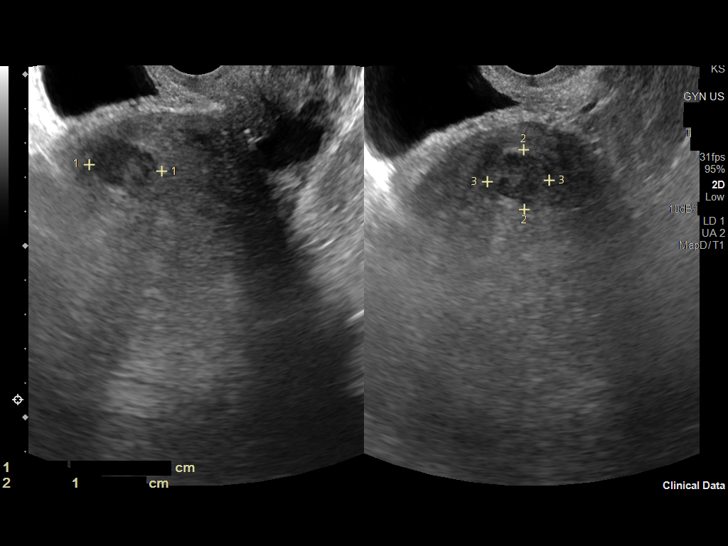
[im 72/79]
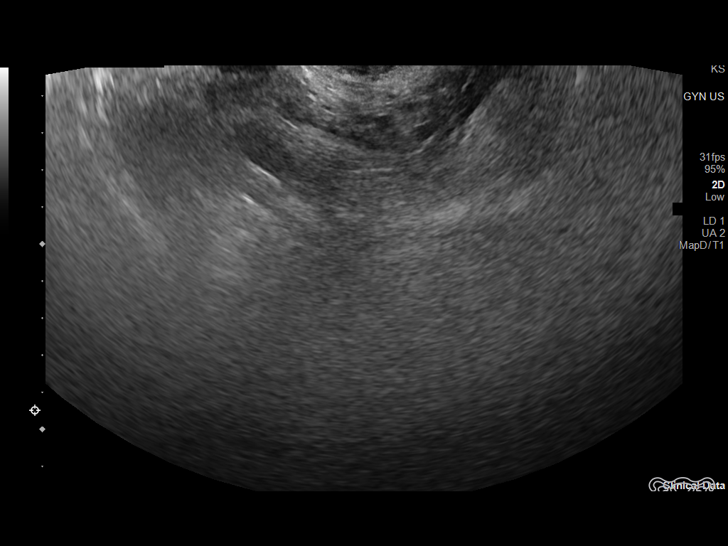
[im 79/79]
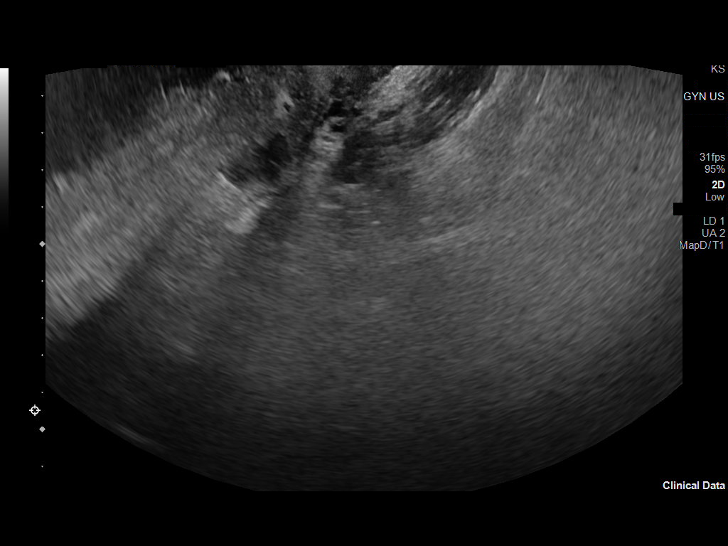

[13 of 25 positions shown; findings below may reference images not displayed]

FINDINGS: Uterus

Measurements: 70.2 x 9.6 x 10.8 cm = volume: 936 mL. Intramural
anterior right uterine fibroids measuring 6.5 x 5.9 x 6 cm and 1.5 x
1.5 x 1.4 cm.

Endometrium

Thickness: 23 mm.  No focal abnormality visualized.

Right ovary

Measurements: 3.9 x 3 x 2.4 cm = volume: 15 mL. Normal appearance/no
adnexal mass.

Left ovary

Measurements: 5.9 x 2.5 x 2.8 cm = volume: 22 mL. Normal
appearance/no adnexal mass.

Pulsed Doppler evaluation of both ovaries demonstrates normal
low-resistance arterial and venous waveforms.

Other findings

No abnormal free fluid.
IMPRESSION: 1. Thickened endometrium measuring up to 23 mm. Underlying
malignancy cannot be excluded. Recommend gynecologic consultation.
2. Intramural uterine fibroids measuring up to 6.5 cm.

## 2023-01-04 ENCOUNTER — Ambulatory Visit
Admission: RE | Admit: 2023-01-04 | Discharge: 2023-01-04 | Disposition: A | Discharge status: Home / Self Care | Payer: BC Managed Care – PPO | Source: Ambulatory Visit | Attending: Obstetrics and Gynecology | Admitting: Obstetrics and Gynecology

## 2023-01-04 DIAGNOSIS — Z1231 Encounter for screening mammogram for malignant neoplasm of breast: Secondary | ICD-10-CM

## 2023-01-26 DIAGNOSIS — Z23 Encounter for immunization: Secondary | ICD-10-CM | POA: Diagnosis not present

## 2023-01-26 DIAGNOSIS — N951 Menopausal and female climacteric states: Secondary | ICD-10-CM | POA: Diagnosis not present

## 2023-01-26 DIAGNOSIS — E7800 Pure hypercholesterolemia, unspecified: Secondary | ICD-10-CM | POA: Diagnosis not present

## 2023-01-26 DIAGNOSIS — J309 Allergic rhinitis, unspecified: Secondary | ICD-10-CM | POA: Diagnosis not present

## 2023-01-26 DIAGNOSIS — Z0000 Encounter for general adult medical examination without abnormal findings: Secondary | ICD-10-CM | POA: Diagnosis not present

## 2023-01-26 DIAGNOSIS — D509 Iron deficiency anemia, unspecified: Secondary | ICD-10-CM | POA: Diagnosis not present

## 2023-05-27 DIAGNOSIS — Z23 Encounter for immunization: Secondary | ICD-10-CM | POA: Diagnosis not present

## 2023-08-22 DIAGNOSIS — M26621 Arthralgia of right temporomandibular joint: Secondary | ICD-10-CM | POA: Diagnosis not present

## 2023-08-22 DIAGNOSIS — R519 Headache, unspecified: Secondary | ICD-10-CM | POA: Diagnosis not present

## 2023-08-22 DIAGNOSIS — K141 Geographic tongue: Secondary | ICD-10-CM | POA: Diagnosis not present

## 2023-09-21 ENCOUNTER — Other Ambulatory Visit: Payer: Self-pay | Admitting: Obstetrics and Gynecology

## 2023-09-21 DIAGNOSIS — Z1231 Encounter for screening mammogram for malignant neoplasm of breast: Secondary | ICD-10-CM

## 2023-12-06 DIAGNOSIS — Z01419 Encounter for gynecological examination (general) (routine) without abnormal findings: Secondary | ICD-10-CM | POA: Diagnosis not present

## 2023-12-06 DIAGNOSIS — Z130 Encounter for screening for diseases of the blood and blood-forming organs and certain disorders involving the immune mechanism: Secondary | ICD-10-CM | POA: Diagnosis not present

## 2023-12-06 DIAGNOSIS — N951 Menopausal and female climacteric states: Secondary | ICD-10-CM | POA: Diagnosis not present

## 2023-12-06 DIAGNOSIS — R87610 Atypical squamous cells of undetermined significance on cytologic smear of cervix (ASC-US): Secondary | ICD-10-CM | POA: Diagnosis not present

## 2023-12-26 DIAGNOSIS — Z13228 Encounter for screening for other metabolic disorders: Secondary | ICD-10-CM | POA: Diagnosis not present

## 2023-12-26 DIAGNOSIS — Z1329 Encounter for screening for other suspected endocrine disorder: Secondary | ICD-10-CM | POA: Diagnosis not present

## 2023-12-26 DIAGNOSIS — Z130 Encounter for screening for diseases of the blood and blood-forming organs and certain disorders involving the immune mechanism: Secondary | ICD-10-CM | POA: Diagnosis not present

## 2024-01-06 ENCOUNTER — Ambulatory Visit
Admission: RE | Admit: 2024-01-06 | Discharge: 2024-01-06 | Disposition: A | Discharge status: Home / Self Care | Source: Ambulatory Visit | Attending: Obstetrics and Gynecology | Admitting: Obstetrics and Gynecology

## 2024-01-06 DIAGNOSIS — Z1231 Encounter for screening mammogram for malignant neoplasm of breast: Secondary | ICD-10-CM
